# Patient Record
Sex: Male | Born: 1955 | Race: White | Hispanic: No | Marital: Single
Health system: Northeastern US, Academic
[De-identification: ages and names within clinical notes are randomized; demographics above are authoritative.]

---

## 2007-04-12 ENCOUNTER — Emergency Department: Payer: Self-pay | Admitting: Emergency Medicine

## 2007-05-19 ENCOUNTER — Other Ambulatory Visit: Payer: Self-pay

## 2007-05-20 ENCOUNTER — Observation Stay: Payer: Self-pay | Admitting: Internal Medicine

## 2007-05-20 ENCOUNTER — Other Ambulatory Visit: Payer: Self-pay

## 2009-07-27 IMAGING — US US PELVIS LIMITED
1 series · 17 of 25 positions shown · non-contrast
Comparison: none

REASON FOR EXAM: Testicular pain
COMMENTS:

[Series 1: us pelvis limited · 17 of 37 slices shown]
[im 1/37]
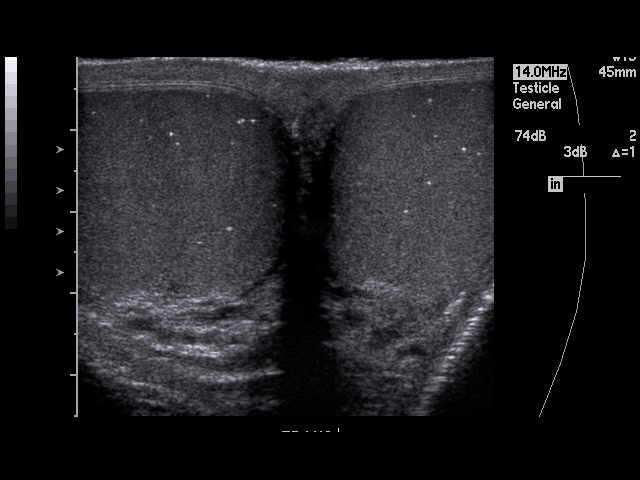
[im 4/37]
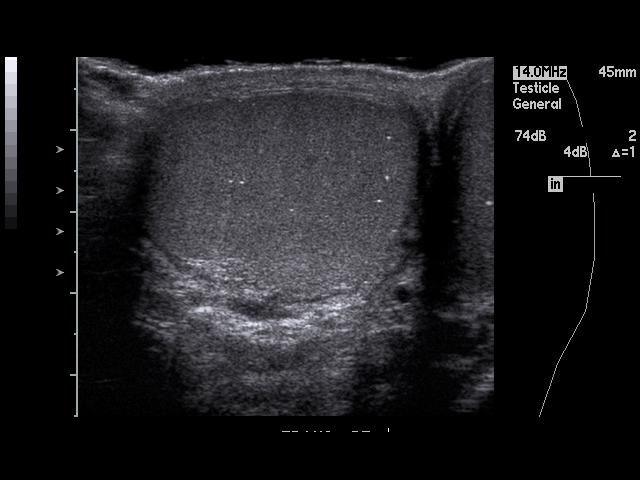
[im 5/37]
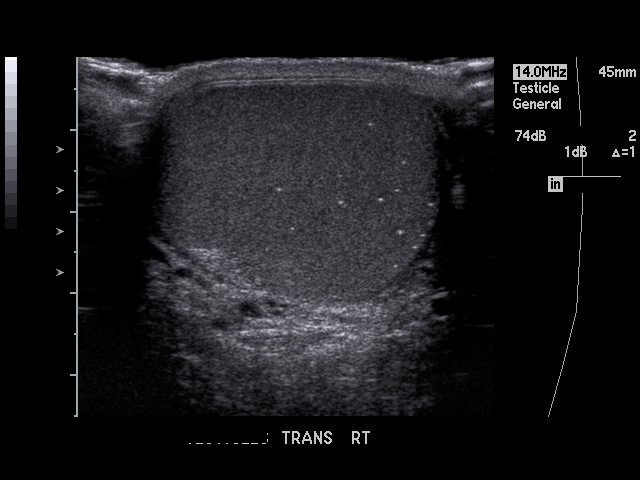
[im 8/37]
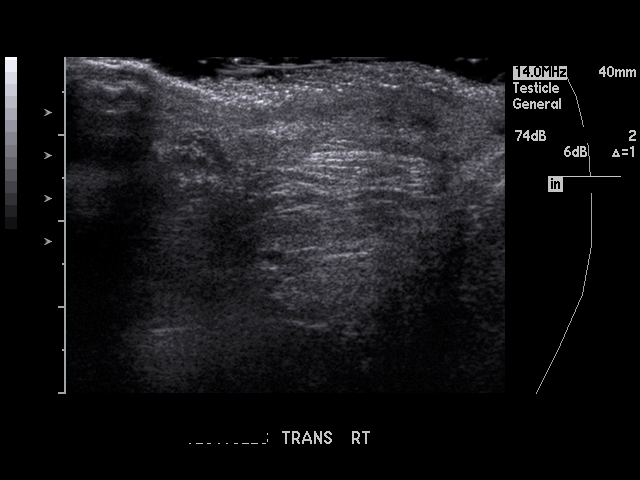
[im 10/37]
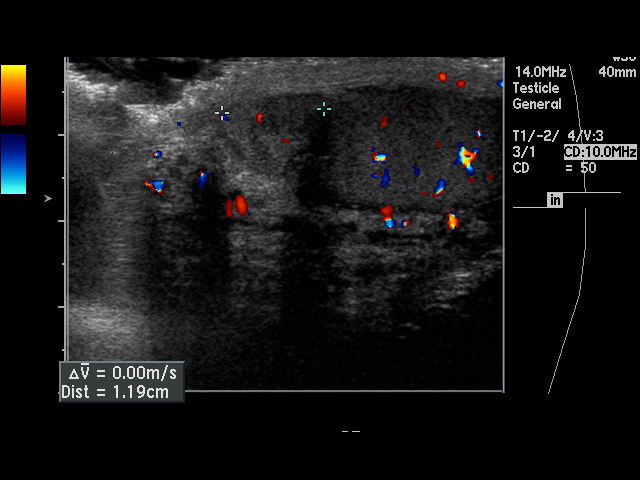
[im 13/37]
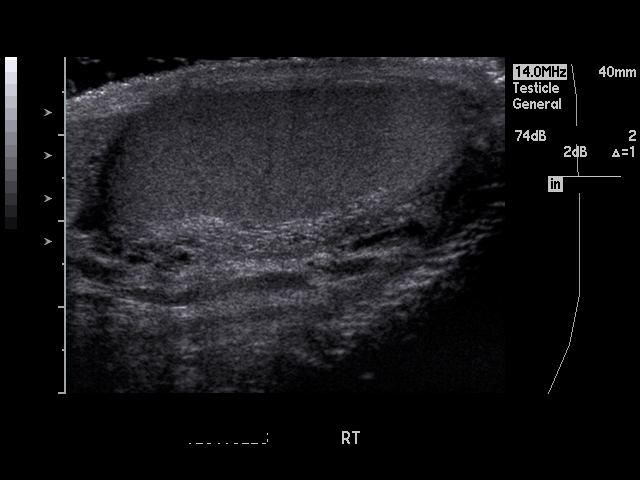
[im 14/37]
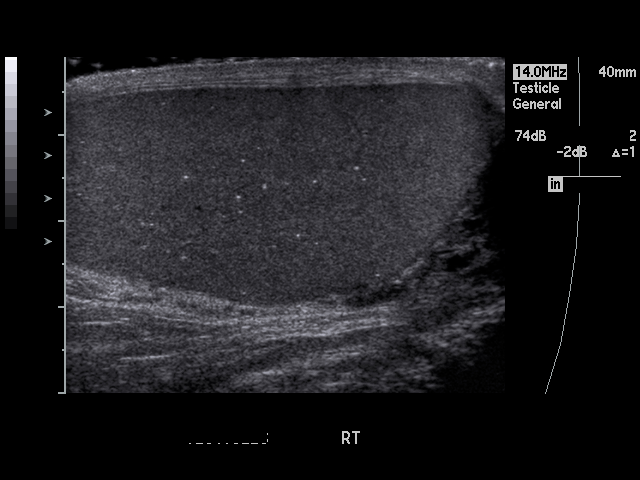
[im 17/37]
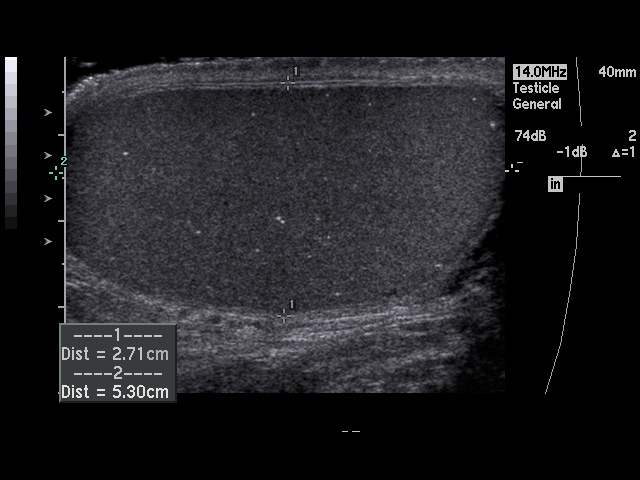
[im 19/37]
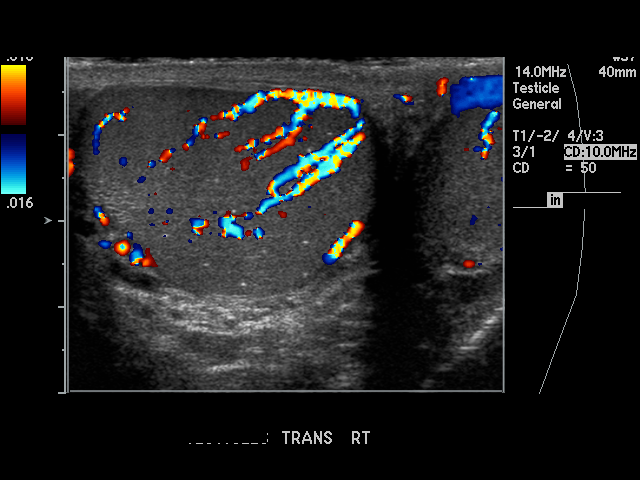
[im 20/37]
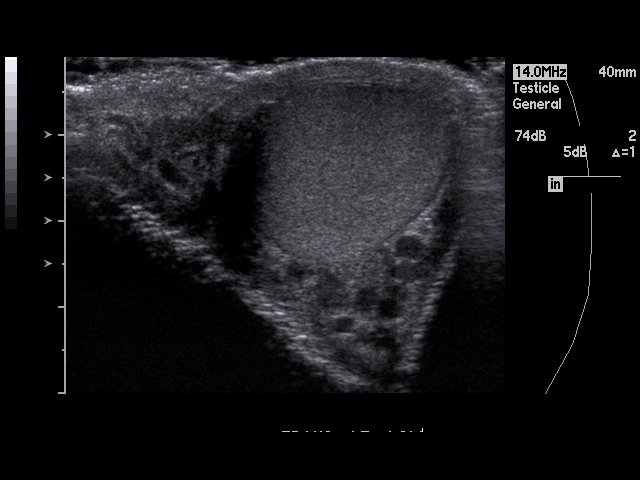
[im 23/37]
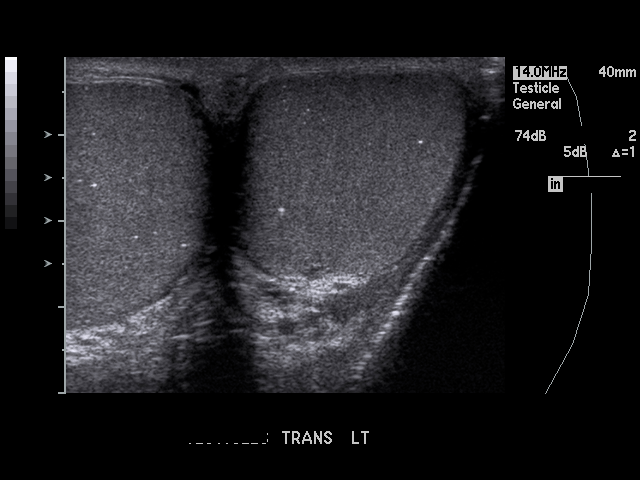
[im 25/37]
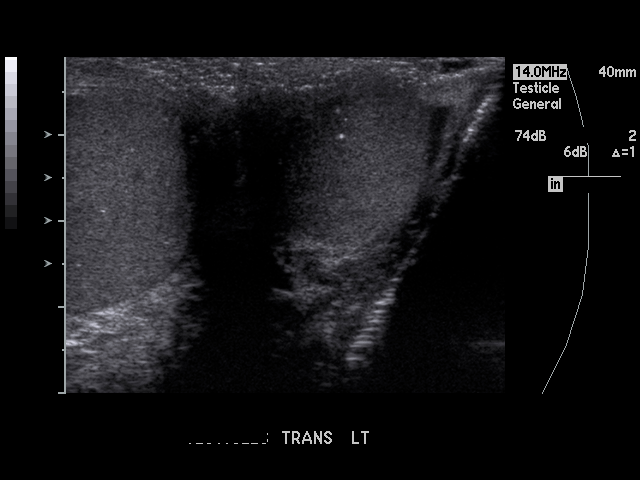
[im 28/37]
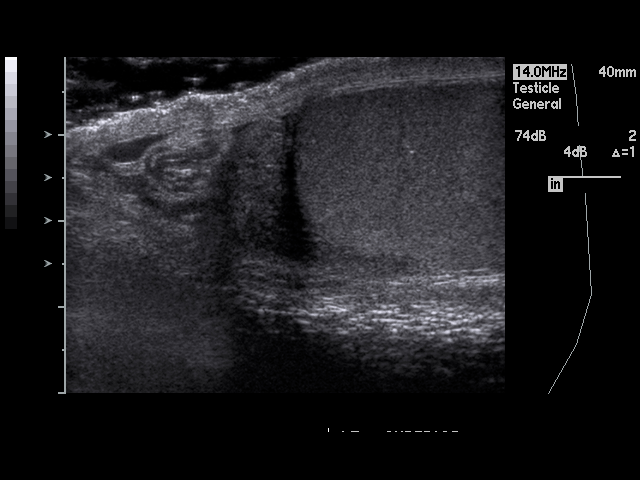
[im 29/37]
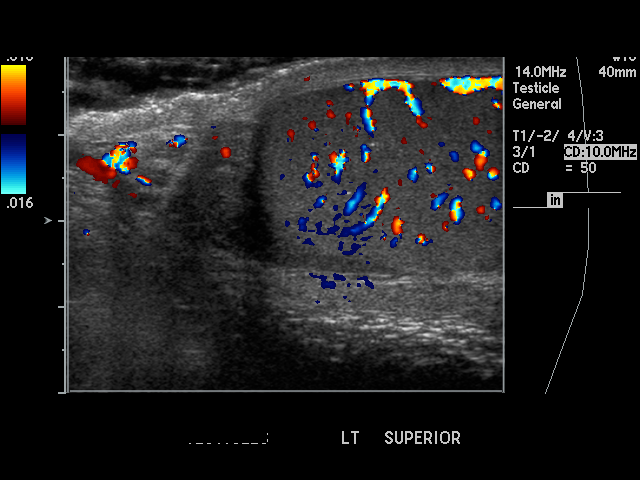
[im 32/37]
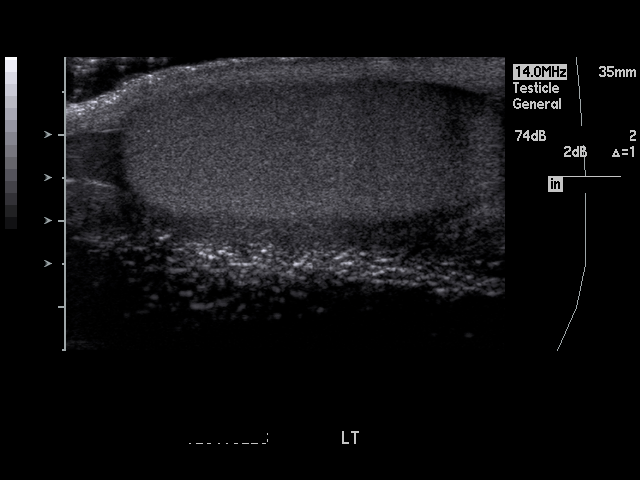
[im 34/37]
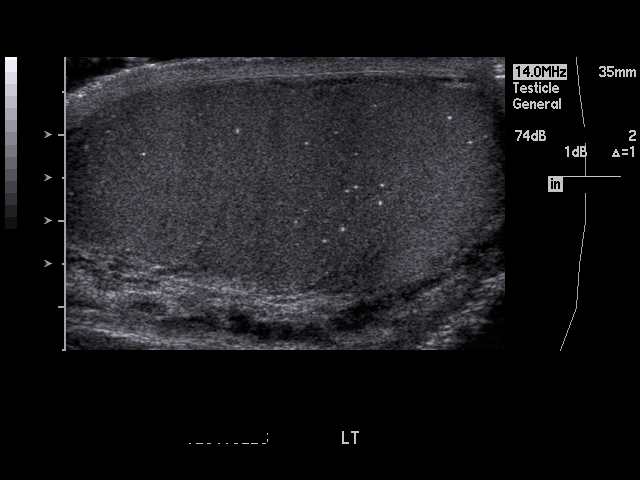
[im 37/37]
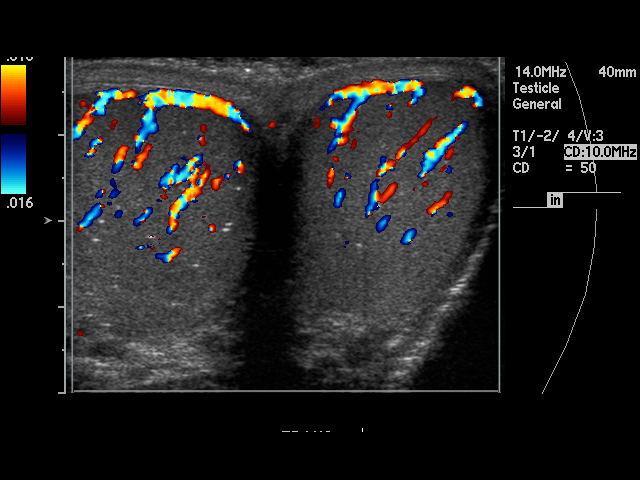

[17 of 25 positions shown; findings below may reference images not displayed]

PROCEDURE:     US  - US TESTICULAR  - April 12, 2007  [DATE]

RESULT:     The RIGHT testicle measures 5.3 x 2.7 x 3.4 cm. The LEFT
testicle measures 5.5 x 2.9 x 2.4 cm. The epididymal structures are normal
in appearance. No intratesticular masses are seen and the vascularity of the
testes is normal.
IMPRESSION: 1.  There are no findings to suggest testicular torsion or testicular
masses. There are very tiny echogenic foci within the testes compatible with
microliths which is a nonspecific finding. Follow-up clinically is
recommended.
2.  There are tiny, bilateral hydroceles.

A preliminary report was sent to the [HOSPITAL] the conclusion
of the study.

## 2011-10-03 LAB — URINALYSIS, COMPLETE
Bacteria: NONE SEEN
Bilirubin,UR: NEGATIVE
Glucose,UR: NEGATIVE mg/dL (ref 0–75)
Ketone: NEGATIVE
Leukocyte Esterase: NEGATIVE
Nitrite: NEGATIVE
Ph: 7 (ref 4.5–8.0)
RBC,UR: 1 /HPF (ref 0–5)
Specific Gravity: 1.005 (ref 1.003–1.030)
Squamous Epithelial: NONE SEEN

## 2011-10-03 LAB — CK TOTAL AND CKMB (NOT AT ARMC): CK, Total: 812 U/L — ABNORMAL HIGH (ref 35–232)

## 2011-10-03 LAB — COMPREHENSIVE METABOLIC PANEL
Alkaline Phosphatase: 128 U/L (ref 50–136)
Anion Gap: 13 (ref 7–16)
Calcium, Total: 8.2 mg/dL — ABNORMAL LOW (ref 8.5–10.1)
Chloride: 96 mmol/L — ABNORMAL LOW (ref 98–107)
EGFR (African American): >60
EGFR (Non-African Amer.): >60
Glucose: 104 mg/dL — ABNORMAL HIGH (ref 65–99)
Osmolality: 269 (ref 275–301)
Potassium: 3.4 mmol/L — ABNORMAL LOW (ref 3.5–5.1)
SGOT(AST): 89 U/L — ABNORMAL HIGH (ref 15–37)
SGPT (ALT): 69 U/L
Sodium: 134 mmol/L — ABNORMAL LOW (ref 136–145)
Total Protein: 8.5 g/dL — ABNORMAL HIGH (ref 6.4–8.2)

## 2011-10-03 LAB — CBC
HCT: 41.9 % (ref 40.0–52.0)
HGB: 14 g/dL (ref 13.0–18.0)
MCH: 32.1 pg (ref 26.0–34.0)
Platelet: 155 10*3/uL (ref 150–440)

## 2011-10-03 LAB — DRUG SCREEN, URINE
Amphetamines, Ur Screen: NEGATIVE (ref ?–1000)
Barbiturates, Ur Screen: NEGATIVE (ref ?–200)
Benzodiazepine, Ur Scrn: NEGATIVE (ref ?–200)
Cannabinoid 50 Ng, Ur ~~LOC~~: NEGATIVE (ref ?–50)
Cocaine Metabolite,Ur ~~LOC~~: NEGATIVE (ref ?–300)
Methadone, Ur Screen: NEGATIVE (ref ?–300)
Opiate, Ur Screen: NEGATIVE (ref ?–300)
Tricyclic, Ur Screen: NEGATIVE (ref ?–1000)

## 2011-10-03 LAB — ETHANOL
Ethanol %: 0.297 % — ABNORMAL HIGH (ref 0.000–0.080)
Ethanol: 297 mg/dL

## 2011-10-03 LAB — MAGNESIUM: Magnesium: 2 mg/dL

## 2011-10-04 ENCOUNTER — Inpatient Hospital Stay: Payer: Self-pay | Admitting: Internal Medicine

## 2011-10-04 LAB — COMPREHENSIVE METABOLIC PANEL
Albumin: 3.3 g/dL — ABNORMAL LOW (ref 3.4–5.0)
BUN: 13 mg/dL (ref 7–18)
Bilirubin,Total: 0.7 mg/dL (ref 0.2–1.0)
Chloride: 106 mmol/L (ref 98–107)
EGFR (African American): >60
SGOT(AST): 91 U/L — ABNORMAL HIGH (ref 15–37)
SGPT (ALT): 55 U/L

## 2011-10-04 LAB — CBC WITH DIFFERENTIAL/PLATELET
Basophil %: 0.5 %
Eosinophil %: 1 %
HCT: 36 % — ABNORMAL LOW (ref 40.0–52.0)
HGB: 12.1 g/dL — ABNORMAL LOW (ref 13.0–18.0)
Lymphocyte %: 22.8 %
MCH: 32.1 pg (ref 26.0–34.0)
MCV: 95 fL (ref 80–100)
Monocyte #: 0.6 x10 3/mm (ref 0.2–1.0)
Neutrophil #: 4.6 10*3/uL (ref 1.4–6.5)
Platelet: 132 10*3/uL — ABNORMAL LOW (ref 150–440)
RDW: 13 % (ref 11.5–14.5)
WBC: 6.9 10*3/uL (ref 3.8–10.6)

## 2011-10-04 LAB — POTASSIUM: Potassium: 3.4 mmol/L — ABNORMAL LOW (ref 3.5–5.1)

## 2011-10-05 LAB — URINALYSIS, COMPLETE
Bacteria: NONE SEEN
Bilirubin,UR: NEGATIVE
Leukocyte Esterase: NEGATIVE
Ph: 5 (ref 4.5–8.0)

## 2011-10-06 LAB — MAGNESIUM: Magnesium: 1.3 mg/dL — ABNORMAL LOW

## 2011-10-06 LAB — PHOSPHORUS: Phosphorus: 3.3 mg/dL (ref 2.5–4.9)

## 2011-10-07 LAB — CBC WITH DIFFERENTIAL/PLATELET
Basophil #: 0.6 10*3/uL — ABNORMAL HIGH (ref 0.0–0.1)
Basophil %: 4.3 %
Eosinophil #: 0.4 10*3/uL (ref 0.0–0.7)
HGB: 12.5 g/dL — ABNORMAL LOW (ref 13.0–18.0)
Lymphocyte #: 0.8 10*3/uL — ABNORMAL LOW (ref 1.0–3.6)
Lymphocyte %: 6.5 %
MCHC: 33.1 g/dL (ref 32.0–36.0)
MCV: 97 fL (ref 80–100)
Monocyte #: 2 x10 3/mm — ABNORMAL HIGH (ref 0.2–1.0)
Monocyte %: 15.5 %
Neutrophil %: 70.9 %
Platelet: 177 10*3/uL (ref 150–440)
RBC: 3.92 10*6/uL — ABNORMAL LOW (ref 4.40–5.90)
RDW: 13 % (ref 11.5–14.5)

## 2011-10-07 LAB — POTASSIUM: Potassium: 3 mmol/L — ABNORMAL LOW (ref 3.5–5.1)

## 2011-10-07 LAB — BASIC METABOLIC PANEL
Anion Gap: 10 (ref 7–16)
Calcium, Total: 8.4 mg/dL — ABNORMAL LOW (ref 8.5–10.1)
Creatinine: 0.91 mg/dL (ref 0.60–1.30)
EGFR (Non-African Amer.): >60
Glucose: 100 mg/dL — ABNORMAL HIGH (ref 65–99)
Potassium: 3.4 mmol/L — ABNORMAL LOW (ref 3.5–5.1)

## 2011-10-08 LAB — COMPREHENSIVE METABOLIC PANEL
Albumin: 2.1 g/dL — ABNORMAL LOW (ref 3.4–5.0)
Alkaline Phosphatase: 91 U/L (ref 50–136)
Bilirubin,Total: 0.8 mg/dL (ref 0.2–1.0)
Calcium, Total: 8.7 mg/dL (ref 8.5–10.1)
Chloride: 107 mmol/L (ref 98–107)
Co2: 21 mmol/L (ref 21–32)
EGFR (African American): >60
EGFR (Non-African Amer.): >60
Potassium: 5.8 mmol/L — ABNORMAL HIGH (ref 3.5–5.1)
SGOT(AST): 81 U/L — ABNORMAL HIGH (ref 15–37)
SGPT (ALT): 32 U/L
Sodium: 136 mmol/L (ref 136–145)
Total Protein: 7 g/dL (ref 6.4–8.2)

## 2011-10-08 LAB — LIPASE, BLOOD: Lipase: 479 U/L — ABNORMAL HIGH (ref 73–393)

## 2011-10-08 LAB — AMYLASE: Amylase: 92 U/L (ref 25–115)

## 2011-10-08 LAB — MAGNESIUM: Magnesium: 1.9 mg/dL

## 2011-10-09 LAB — BASIC METABOLIC PANEL
Anion Gap: 8 (ref 7–16)
BUN: 7 mg/dL (ref 7–18)
Calcium, Total: 8.5 mg/dL (ref 8.5–10.1)
Co2: 28 mmol/L (ref 21–32)
Creatinine: 0.67 mg/dL (ref 0.60–1.30)
EGFR (African American): >60
EGFR (Non-African Amer.): >60
Glucose: 115 mg/dL — ABNORMAL HIGH (ref 65–99)
Potassium: 3.7 mmol/L (ref 3.5–5.1)

## 2011-10-09 LAB — PHENYTOIN LEVEL, TOTAL: Dilantin: 3.9 ug/mL — ABNORMAL LOW (ref 10.0–20.0)

## 2011-10-09 LAB — MAGNESIUM: Magnesium: 1.3 mg/dL — ABNORMAL LOW

## 2011-10-09 LAB — VANCOMYCIN, TROUGH: Vancomycin, Trough: 11 ug/mL (ref 10–20)

## 2011-10-10 LAB — POTASSIUM
Potassium: 3.4 mmol/L — ABNORMAL LOW (ref 3.5–5.1)
Potassium: 3.9 mmol/L (ref 3.5–5.1)

## 2011-10-10 LAB — VANCOMYCIN, TROUGH: Vancomycin, Trough: 16 ug/mL (ref 10–20)

## 2011-10-11 LAB — BASIC METABOLIC PANEL
Anion Gap: 8 (ref 7–16)
Calcium, Total: 8.6 mg/dL (ref 8.5–10.1)
Chloride: 105 mmol/L (ref 98–107)
Co2: 29 mmol/L (ref 21–32)
Creatinine: 0.6 mg/dL (ref 0.60–1.30)
EGFR (African American): >60
EGFR (Non-African Amer.): >60
Glucose: 153 mg/dL — ABNORMAL HIGH (ref 65–99)
Osmolality: 285 (ref 275–301)
Sodium: 142 mmol/L (ref 136–145)

## 2011-10-11 LAB — CBC WITH DIFFERENTIAL/PLATELET
Basophil #: 0 10*3/uL (ref 0.0–0.1)
Basophil %: 0.8 %
HCT: 34.5 % — ABNORMAL LOW (ref 40.0–52.0)
HGB: 11.4 g/dL — ABNORMAL LOW (ref 13.0–18.0)
MCH: 31.8 pg (ref 26.0–34.0)
Monocyte %: 21.3 %
Neutrophil %: 50.2 %
Platelet: 293 10*3/uL (ref 150–440)
RBC: 3.58 10*6/uL — ABNORMAL LOW (ref 4.40–5.90)
RDW: 12.7 % (ref 11.5–14.5)

## 2011-10-11 LAB — MAGNESIUM: Magnesium: 1.5 mg/dL — ABNORMAL LOW

## 2011-10-12 LAB — BASIC METABOLIC PANEL
Anion Gap: 7 (ref 7–16)
BUN: 8 mg/dL (ref 7–18)
Calcium, Total: 8.5 mg/dL (ref 8.5–10.1)
Chloride: 103 mmol/L (ref 98–107)
Co2: 29 mmol/L (ref 21–32)
Creatinine: 0.69 mg/dL (ref 0.60–1.30)
EGFR (Non-African Amer.): >60
Glucose: 250 mg/dL — ABNORMAL HIGH (ref 65–99)
Osmolality: 284 (ref 275–301)
Sodium: 139 mmol/L (ref 136–145)

## 2011-10-12 LAB — PHOSPHORUS: Phosphorus: 3.9 mg/dL (ref 2.5–4.9)

## 2011-10-13 LAB — PHOSPHORUS: Phosphorus: 3.7 mg/dL (ref 2.5–4.9)

## 2011-10-13 LAB — PHENYTOIN LEVEL, TOTAL: Dilantin: 2.2 ug/mL — ABNORMAL LOW (ref 10.0–20.0)

## 2011-10-13 LAB — CALCIUM: Calcium, Total: 8.7 mg/dL (ref 8.5–10.1)

## 2011-10-13 LAB — CULTURE, BLOOD (SINGLE)

## 2011-10-14 LAB — CBC WITH DIFFERENTIAL/PLATELET
Basophil #: 0.1 10*3/uL (ref 0.0–0.1)
Basophil %: 0.5 %
Eosinophil #: 0.4 10*3/uL (ref 0.0–0.7)
Eosinophil %: 4.4 %
HGB: 11 g/dL — ABNORMAL LOW (ref 13.0–18.0)
Lymphocyte %: 15.3 %
MCHC: 33.2 g/dL (ref 32.0–36.0)
MCV: 96 fL (ref 80–100)
Monocyte #: 1.6 x10 3/mm — ABNORMAL HIGH (ref 0.2–1.0)
Monocyte %: 17.1 %
Neutrophil #: 5.9 10*3/uL (ref 1.4–6.5)
Neutrophil %: 62.7 %
Platelet: 349 10*3/uL (ref 150–440)
RDW: 12.5 % (ref 11.5–14.5)
WBC: 9.5 10*3/uL (ref 3.8–10.6)

## 2011-10-14 LAB — BASIC METABOLIC PANEL
Anion Gap: 6 — ABNORMAL LOW (ref 7–16)
Chloride: 96 mmol/L — ABNORMAL LOW (ref 98–107)
EGFR (Non-African Amer.): >60
Glucose: 306 mg/dL — ABNORMAL HIGH (ref 65–99)
Osmolality: 283 (ref 275–301)
Potassium: 3.6 mmol/L (ref 3.5–5.1)
Sodium: 136 mmol/L (ref 136–145)

## 2011-10-14 LAB — MAGNESIUM: Magnesium: 1.8 mg/dL

## 2011-10-14 LAB — PHOSPHORUS: Phosphorus: 3 mg/dL (ref 2.5–4.9)

## 2011-10-15 LAB — SODIUM: Sodium: 139 mmol/L (ref 136–145)

## 2011-10-15 LAB — ALBUMIN: Albumin: 2 g/dL — ABNORMAL LOW (ref 3.4–5.0)

## 2011-10-15 LAB — MAGNESIUM: Magnesium: 1.7 mg/dL — ABNORMAL LOW

## 2011-10-15 LAB — PHOSPHORUS: Phosphorus: 3.4 mg/dL (ref 2.5–4.9)

## 2011-10-15 LAB — CALCIUM: Calcium, Total: 9.1 mg/dL (ref 8.5–10.1)

## 2011-10-15 LAB — POTASSIUM: Potassium: 3.5 mmol/L (ref 3.5–5.1)

## 2011-10-16 LAB — CBC WITH DIFFERENTIAL/PLATELET
Basophil #: 0.1 10*3/uL (ref 0.0–0.1)
Basophil %: 0.4 %
Eosinophil %: 4.1 %
HCT: 33.3 % — ABNORMAL LOW (ref 40.0–52.0)
HGB: 10.4 g/dL — ABNORMAL LOW (ref 13.0–18.0)
Lymphocyte %: 11.4 %
MCHC: 31.2 g/dL — ABNORMAL LOW (ref 32.0–36.0)
MCV: 97 fL (ref 80–100)
Neutrophil %: 70.4 %
RBC: 3.44 10*6/uL — ABNORMAL LOW (ref 4.40–5.90)
RDW: 13 % (ref 11.5–14.5)
WBC: 14.9 10*3/uL — ABNORMAL HIGH (ref 3.8–10.6)

## 2011-10-16 LAB — BASIC METABOLIC PANEL
Anion Gap: 6 — ABNORMAL LOW (ref 7–16)
BUN: 15 mg/dL (ref 7–18)
Calcium, Total: 9.2 mg/dL (ref 8.5–10.1)
Chloride: 102 mmol/L (ref 98–107)
Co2: 32 mmol/L (ref 21–32)
Creatinine: 0.73 mg/dL (ref 0.60–1.30)
EGFR (African American): >60
EGFR (Non-African Amer.): >60

## 2011-10-16 LAB — URINALYSIS, COMPLETE
Bacteria: NONE SEEN
Ketone: NEGATIVE
Leukocyte Esterase: NEGATIVE
Nitrite: NEGATIVE
Protein: NEGATIVE
Specific Gravity: 1.01 (ref 1.003–1.030)

## 2011-10-17 LAB — BASIC METABOLIC PANEL
Anion Gap: 8 (ref 7–16)
BUN: 11 mg/dL (ref 7–18)
Chloride: 98 mmol/L (ref 98–107)
Co2: 34 mmol/L — ABNORMAL HIGH (ref 21–32)
Creatinine: 0.68 mg/dL (ref 0.60–1.30)
EGFR (Non-African Amer.): >60
Glucose: 161 mg/dL — ABNORMAL HIGH (ref 65–99)
Potassium: 3.6 mmol/L (ref 3.5–5.1)

## 2011-10-17 LAB — CBC WITH DIFFERENTIAL/PLATELET
Basophil #: 0.3 10*3/uL — ABNORMAL HIGH (ref 0.0–0.1)
Basophil %: 2.4 %
Eosinophil #: 0.9 10*3/uL — ABNORMAL HIGH (ref 0.0–0.7)
Eosinophil %: 7.7 %
HCT: 33.4 % — ABNORMAL LOW (ref 40.0–52.0)
HGB: 10.9 g/dL — ABNORMAL LOW (ref 13.0–18.0)
MCH: 31.2 pg (ref 26.0–34.0)
MCHC: 32.7 g/dL (ref 32.0–36.0)
Monocyte %: 14.4 %
Neutrophil #: 8 10*3/uL — ABNORMAL HIGH (ref 1.4–6.5)
WBC: 12.1 10*3/uL — ABNORMAL HIGH (ref 3.8–10.6)

## 2011-10-17 LAB — PHOSPHORUS: Phosphorus: 3.3 mg/dL (ref 2.5–4.9)

## 2011-10-18 LAB — CBC WITH DIFFERENTIAL/PLATELET
Bands: 7 %
Basophil: 1 %
Comment - H1-Com1: NORMAL
MCHC: 32.5 g/dL (ref 32.0–36.0)
MCV: 96 fL (ref 80–100)
Metamyelocyte: 1 %
Myelocyte: 4 %
RBC: 3.28 10*6/uL — ABNORMAL LOW (ref 4.40–5.90)
RDW: 13 % (ref 11.5–14.5)
Segmented Neutrophils: 54 %
Variant Lymphocyte - H1-Rlymph: 9 %
WBC: 12.1 10*3/uL — ABNORMAL HIGH (ref 3.8–10.6)

## 2011-10-18 LAB — BASIC METABOLIC PANEL
BUN: 15 mg/dL (ref 7–18)
Calcium, Total: 9.2 mg/dL (ref 8.5–10.1)
Co2: 35 mmol/L — ABNORMAL HIGH (ref 21–32)
EGFR (African American): >60
EGFR (Non-African Amer.): >60
Potassium: 4.1 mmol/L (ref 3.5–5.1)

## 2011-10-18 LAB — MAGNESIUM: Magnesium: 1.9 mg/dL

## 2011-10-19 LAB — CBC WITH DIFFERENTIAL/PLATELET
Bands: 3 %
Comment - H1-Com2: NORMAL
Eosinophil: 3 %
HGB: 10.7 g/dL — ABNORMAL LOW (ref 13.0–18.0)
Lymphocytes: 12 %
MCHC: 33 g/dL (ref 32.0–36.0)
Metamyelocyte: 1 %
Monocytes: 21 %
Platelet: 399 10*3/uL (ref 150–440)
RBC: 3.39 10*6/uL — ABNORMAL LOW (ref 4.40–5.90)
RDW: 13 % (ref 11.5–14.5)
Variant Lymphocyte - H1-Rlymph: 1 %

## 2011-10-19 LAB — BASIC METABOLIC PANEL
Anion Gap: 7 (ref 7–16)
Calcium, Total: 9.3 mg/dL (ref 8.5–10.1)
Chloride: 99 mmol/L (ref 98–107)
Creatinine: 0.78 mg/dL (ref 0.60–1.30)
EGFR (African American): >60
EGFR (Non-African Amer.): >60
Glucose: 153 mg/dL — ABNORMAL HIGH (ref 65–99)
Osmolality: 286 (ref 275–301)
Potassium: 4.1 mmol/L (ref 3.5–5.1)

## 2011-10-19 LAB — PHOSPHORUS: Phosphorus: 4.3 mg/dL (ref 2.5–4.9)

## 2011-10-19 LAB — MAGNESIUM: Magnesium: 1.8 mg/dL

## 2011-10-20 DIAGNOSIS — I509 Heart failure, unspecified: Secondary | ICD-10-CM

## 2011-10-20 LAB — CBC WITH DIFFERENTIAL/PLATELET
Basophil #: 0.1 10*3/uL (ref 0.0–0.1)
Eosinophil %: 4.1 %
HCT: 30.7 % — ABNORMAL LOW (ref 40.0–52.0)
HGB: 10.3 g/dL — ABNORMAL LOW (ref 13.0–18.0)
Lymphocyte #: 1.2 10*3/uL (ref 1.0–3.6)
MCH: 32.1 pg (ref 26.0–34.0)
MCV: 96 fL (ref 80–100)
Monocyte #: 2.3 x10 3/mm — ABNORMAL HIGH (ref 0.2–1.0)
Monocyte %: 17.5 %
Neutrophil #: 9 10*3/uL — ABNORMAL HIGH (ref 1.4–6.5)
Neutrophil %: 68.4 %
RDW: 12.7 % (ref 11.5–14.5)
WBC: 13.1 10*3/uL — ABNORMAL HIGH (ref 3.8–10.6)

## 2011-10-20 LAB — BASIC METABOLIC PANEL
Co2: 33 mmol/L — ABNORMAL HIGH (ref 21–32)
Creatinine: 0.61 mg/dL (ref 0.60–1.30)
EGFR (African American): >60
EGFR (Non-African Amer.): >60
Osmolality: 282 (ref 275–301)
Potassium: 3.5 mmol/L (ref 3.5–5.1)
Sodium: 138 mmol/L (ref 136–145)

## 2011-10-20 LAB — PROTIME-INR
INR: 1.1
Prothrombin Time: 14.8 secs — ABNORMAL HIGH (ref 11.5–14.7)

## 2011-10-21 LAB — BASIC METABOLIC PANEL
Anion Gap: 6 — ABNORMAL LOW (ref 7–16)
Calcium, Total: 8.7 mg/dL (ref 8.5–10.1)
Chloride: 101 mmol/L (ref 98–107)
Co2: 32 mmol/L (ref 21–32)
Creatinine: 0.56 mg/dL — ABNORMAL LOW (ref 0.60–1.30)
Glucose: 124 mg/dL — ABNORMAL HIGH (ref 65–99)

## 2011-10-21 LAB — URINALYSIS, COMPLETE
Glucose,UR: NEGATIVE mg/dL (ref 0–75)
Hyaline Cast: 1
Leukocyte Esterase: NEGATIVE
Nitrite: NEGATIVE
RBC,UR: <1 /HPF (ref 0–5)
Specific Gravity: 1.012 (ref 1.003–1.030)

## 2011-10-21 LAB — MAGNESIUM: Magnesium: 1.8 mg/dL

## 2011-10-21 LAB — CBC WITH DIFFERENTIAL/PLATELET
Basophil #: 0.1 10*3/uL (ref 0.0–0.1)
Basophil %: 1.1 %
Lymphocyte %: 5.4 %
MCH: 31 pg (ref 26.0–34.0)
MCHC: 32.5 g/dL (ref 32.0–36.0)
MCV: 96 fL (ref 80–100)
Neutrophil #: 7.9 10*3/uL — ABNORMAL HIGH (ref 1.4–6.5)
Neutrophil %: 69.8 %
RBC: 3.21 10*6/uL — ABNORMAL LOW (ref 4.40–5.90)
WBC: 11.3 10*3/uL — ABNORMAL HIGH (ref 3.8–10.6)

## 2011-10-22 LAB — CBC WITH DIFFERENTIAL/PLATELET
Basophil %: 0.5 %
Eosinophil #: 0 10*3/uL (ref 0.0–0.7)
HCT: 30.4 % — ABNORMAL LOW (ref 40.0–52.0)
Lymphocyte #: 0.5 10*3/uL — ABNORMAL LOW (ref 1.0–3.6)
Lymphocyte %: 2.8 %
MCH: 30.2 pg (ref 26.0–34.0)
MCHC: 31.7 g/dL — ABNORMAL LOW (ref 32.0–36.0)
MCV: 95 fL (ref 80–100)
Monocyte #: 1.6 x10 3/mm — ABNORMAL HIGH (ref 0.2–1.0)
Neutrophil %: 87.4 %
RBC: 3.2 10*6/uL — ABNORMAL LOW (ref 4.40–5.90)
RDW: 12.4 % (ref 11.5–14.5)

## 2011-10-22 LAB — BASIC METABOLIC PANEL
Anion Gap: 6 — ABNORMAL LOW (ref 7–16)
BUN: 15 mg/dL (ref 7–18)
Calcium, Total: 9 mg/dL (ref 8.5–10.1)
Chloride: 99 mmol/L (ref 98–107)
Co2: 33 mmol/L — ABNORMAL HIGH (ref 21–32)
Creatinine: 0.61 mg/dL (ref 0.60–1.30)
EGFR (African American): >60
EGFR (Non-African Amer.): >60
Glucose: 143 mg/dL — ABNORMAL HIGH (ref 65–99)
Osmolality: 279 (ref 275–301)
Potassium: 3.5 mmol/L (ref 3.5–5.1)
Sodium: 138 mmol/L (ref 136–145)

## 2011-10-22 LAB — PHOSPHORUS: Phosphorus: 2.9 mg/dL (ref 2.5–4.9)

## 2011-10-22 LAB — MAGNESIUM: Magnesium: 1.7 mg/dL — ABNORMAL LOW

## 2011-10-23 LAB — CBC WITH DIFFERENTIAL/PLATELET
Basophil #: 0 10*3/uL (ref 0.0–0.1)
Basophil %: 0.4 %
HCT: 28.9 % — ABNORMAL LOW (ref 40.0–52.0)
Lymphocyte #: 0.8 10*3/uL — ABNORMAL LOW (ref 1.0–3.6)
Lymphocyte %: 6.7 %
MCH: 30 pg (ref 26.0–34.0)
MCV: 96 fL (ref 80–100)
Monocyte #: 1.1 x10 3/mm — ABNORMAL HIGH (ref 0.2–1.0)
Monocyte %: 9.5 %
RBC: 3.02 10*6/uL — ABNORMAL LOW (ref 4.40–5.90)
RDW: 12.5 % (ref 11.5–14.5)
WBC: 11.9 10*3/uL — ABNORMAL HIGH (ref 3.8–10.6)

## 2011-10-23 LAB — LIPASE, BLOOD: Lipase: 313 U/L (ref 73–393)

## 2011-10-23 LAB — BASIC METABOLIC PANEL
BUN: 17 mg/dL (ref 7–18)
Calcium, Total: 8.9 mg/dL (ref 8.5–10.1)
Chloride: 100 mmol/L (ref 98–107)
Co2: 32 mmol/L (ref 21–32)
EGFR (African American): >60
EGFR (Non-African Amer.): >60
Glucose: 132 mg/dL — ABNORMAL HIGH (ref 65–99)
Osmolality: 281 (ref 275–301)

## 2011-10-23 LAB — MAGNESIUM: Magnesium: 1.7 mg/dL — ABNORMAL LOW

## 2011-10-24 LAB — CBC WITH DIFFERENTIAL/PLATELET
Basophil #: 0.1 10*3/uL (ref 0.0–0.1)
Eosinophil #: 0.4 10*3/uL (ref 0.0–0.7)
Eosinophil %: 3.2 %
Eosinophil: 4 %
HCT: 28.3 % — ABNORMAL LOW (ref 40.0–52.0)
Lymphocyte #: 1.2 10*3/uL (ref 1.0–3.6)
Lymphocytes: 6 %
MCH: 30.4 pg (ref 26.0–34.0)
MCHC: 31.7 g/dL — ABNORMAL LOW (ref 32.0–36.0)
MCV: 96 fL (ref 80–100)
Monocyte %: 21.9 %
Monocytes: 21 %
Platelet: 227 10*3/uL (ref 150–440)
RDW: 13 % (ref 11.5–14.5)
Segmented Neutrophils: 67 %
WBC: 12.1 10*3/uL — ABNORMAL HIGH (ref 3.8–10.6)

## 2011-10-24 LAB — MAGNESIUM: Magnesium: 1.9 mg/dL

## 2011-10-24 LAB — BASIC METABOLIC PANEL
Anion Gap: 7 (ref 7–16)
Chloride: 102 mmol/L (ref 98–107)
Co2: 32 mmol/L (ref 21–32)
Creatinine: 0.85 mg/dL (ref 0.60–1.30)
EGFR (African American): >60
EGFR (Non-African Amer.): >60
Glucose: 148 mg/dL — ABNORMAL HIGH (ref 65–99)
Osmolality: 286 (ref 275–301)
Potassium: 3.1 mmol/L — ABNORMAL LOW (ref 3.5–5.1)
Sodium: 141 mmol/L (ref 136–145)

## 2011-10-24 LAB — PHOSPHORUS: Phosphorus: 4.1 mg/dL (ref 2.5–4.9)

## 2011-10-25 LAB — CBC WITH DIFFERENTIAL/PLATELET
Basophil #: 0.1 10*3/uL (ref 0.0–0.1)
Basophil %: 1.2 %
Eosinophil #: 0.9 10*3/uL — ABNORMAL HIGH (ref 0.0–0.7)
Eosinophil %: 7.7 %
Eosinophil: 10 %
HCT: 29.7 % — ABNORMAL LOW (ref 40.0–52.0)
HGB: 9.6 g/dL — ABNORMAL LOW (ref 13.0–18.0)
Lymphocyte #: 1.6 10*3/uL (ref 1.0–3.6)
Lymphocyte %: 14.3 %
MCV: 94 fL (ref 80–100)
Monocyte %: 22.4 %
Monocytes: 17 %
Neutrophil %: 54.4 %
RBC: 3.16 10*6/uL — ABNORMAL LOW (ref 4.40–5.90)
WBC: 11.2 10*3/uL — ABNORMAL HIGH (ref 3.8–10.6)

## 2011-10-25 LAB — MAGNESIUM: Magnesium: 1.7 mg/dL — ABNORMAL LOW

## 2011-10-25 LAB — POTASSIUM: Potassium: 3.8 mmol/L (ref 3.5–5.1)

## 2011-10-25 LAB — BASIC METABOLIC PANEL
Chloride: 96 mmol/L — ABNORMAL LOW (ref 98–107)
Creatinine: 0.6 mg/dL (ref 0.60–1.30)
EGFR (Non-African Amer.): >60
Sodium: 138 mmol/L (ref 136–145)

## 2011-10-25 LAB — CALCIUM: Calcium, Total: 9.1 mg/dL (ref 8.5–10.1)

## 2011-10-26 LAB — VANCOMYCIN, TROUGH: Vancomycin, Trough: 14 ug/mL (ref 10–20)

## 2011-10-26 LAB — BASIC METABOLIC PANEL
Calcium, Total: 9 mg/dL (ref 8.5–10.1)
Chloride: 99 mmol/L (ref 98–107)
Co2: 32 mmol/L (ref 21–32)
Creatinine: 0.76 mg/dL (ref 0.60–1.30)
EGFR (African American): >60
Sodium: 139 mmol/L (ref 136–145)

## 2011-10-27 LAB — CREATININE, SERUM
Creatinine: 0.67 mg/dL (ref 0.60–1.30)
EGFR (African American): >60
EGFR (Non-African Amer.): >60

## 2011-10-27 LAB — CBC WITH DIFFERENTIAL/PLATELET
Eosinophil %: 3.8 %
Eosinophil: 1 %
Lymphocyte %: 9.3 %
MCH: 30.5 pg (ref 26.0–34.0)
MCHC: 32.6 g/dL (ref 32.0–36.0)
MCV: 94 fL (ref 80–100)
Monocyte #: 2.7 x10 3/mm — ABNORMAL HIGH (ref 0.2–1.0)
Monocyte %: 15.4 %
Platelet: 309 10*3/uL (ref 150–440)
RBC: 3 10*6/uL — ABNORMAL LOW (ref 4.40–5.90)
RDW: 13.5 % (ref 11.5–14.5)
Segmented Neutrophils: 77 %
WBC: 17.5 10*3/uL — ABNORMAL HIGH (ref 3.8–10.6)

## 2011-10-27 LAB — CALCIUM: Calcium, Total: 9 mg/dL (ref 8.5–10.1)

## 2011-10-27 LAB — POTASSIUM: Potassium: 3.3 mmol/L — ABNORMAL LOW (ref 3.5–5.1)

## 2011-10-27 LAB — PHOSPHORUS: Phosphorus: 3 mg/dL (ref 2.5–4.9)

## 2011-10-28 LAB — MAGNESIUM: Magnesium: 2 mg/dL

## 2011-10-28 LAB — POTASSIUM: Potassium: 3.9 mmol/L (ref 3.5–5.1)

## 2011-10-28 LAB — PHOSPHORUS: Phosphorus: 3.4 mg/dL (ref 2.5–4.9)

## 2011-10-29 LAB — CBC WITH DIFFERENTIAL/PLATELET
Bands: 5 %
Basophil: 1 %
Eosinophil: 13 %
HCT: 31.5 % — ABNORMAL LOW (ref 40.0–52.0)
HGB: 10.2 g/dL — ABNORMAL LOW (ref 13.0–18.0)
Lymphocytes: 14 %
MCHC: 32.3 g/dL (ref 32.0–36.0)
Metamyelocyte: 3 %
Myelocyte: 3 %
Other Cells Blood: 2
RDW: 13 % (ref 11.5–14.5)
Segmented Neutrophils: 48 %
WBC: 13.7 10*3/uL — ABNORMAL HIGH (ref 3.8–10.6)

## 2011-10-29 LAB — BASIC METABOLIC PANEL
BUN: 21 mg/dL — ABNORMAL HIGH (ref 7–18)
Calcium, Total: 9.4 mg/dL (ref 8.5–10.1)
Chloride: 99 mmol/L (ref 98–107)
Co2: 33 mmol/L — ABNORMAL HIGH (ref 21–32)
EGFR (African American): >60
EGFR (Non-African Amer.): >60
Glucose: 125 mg/dL — ABNORMAL HIGH (ref 65–99)
Potassium: 4 mmol/L (ref 3.5–5.1)
Sodium: 138 mmol/L (ref 136–145)

## 2011-10-29 LAB — CULTURE, BLOOD (SINGLE)

## 2011-10-30 LAB — POTASSIUM: Potassium: 3.7 mmol/L (ref 3.5–5.1)

## 2011-10-30 LAB — MAGNESIUM: Magnesium: 2 mg/dL

## 2011-10-30 LAB — PHOSPHORUS: Phosphorus: 3.3 mg/dL (ref 2.5–4.9)

## 2011-10-30 LAB — SODIUM: Sodium: 138 mmol/L (ref 136–145)

## 2011-10-30 LAB — CALCIUM: Calcium, Total: 9.8 mg/dL (ref 8.5–10.1)

## 2011-10-31 LAB — CBC WITH DIFFERENTIAL/PLATELET
Basophil #: 0.1 10*3/uL (ref 0.0–0.1)
Basophil %: 1.1 %
Eosinophil #: 0.8 10*3/uL — ABNORMAL HIGH (ref 0.0–0.7)
Eosinophil %: 7.5 %
HCT: 29.6 % — ABNORMAL LOW (ref 40.0–52.0)
Lymphocyte %: 12 %
MCH: 30.5 pg (ref 26.0–34.0)
MCHC: 32.5 g/dL (ref 32.0–36.0)
MCV: 94 fL (ref 80–100)
Neutrophil #: 7 10*3/uL — ABNORMAL HIGH (ref 1.4–6.5)
RBC: 3.16 10*6/uL — ABNORMAL LOW (ref 4.40–5.90)
RDW: 13.2 % (ref 11.5–14.5)

## 2011-10-31 LAB — PHOSPHORUS: Phosphorus: 3.7 mg/dL (ref 2.5–4.9)

## 2011-11-01 LAB — PHOSPHORUS: Phosphorus: 3.4 mg/dL (ref 2.5–4.9)

## 2011-11-01 LAB — SODIUM: Sodium: 143 mmol/L (ref 136–145)

## 2011-11-01 LAB — POTASSIUM: Potassium: 3.7 mmol/L (ref 3.5–5.1)

## 2011-11-05 LAB — CBC WITH DIFFERENTIAL/PLATELET
Basophil %: 1.5 %
Eosinophil %: 9.9 %
HCT: 34.8 % — ABNORMAL LOW (ref 40.0–52.0)
MCHC: 31.8 g/dL — ABNORMAL LOW (ref 32.0–36.0)
MCV: 93 fL (ref 80–100)
Monocyte %: 10.4 %
Neutrophil #: 6.2 10*3/uL (ref 1.4–6.5)
RBC: 3.74 10*6/uL — ABNORMAL LOW (ref 4.40–5.90)
RDW: 13.4 % (ref 11.5–14.5)
WBC: 10 10*3/uL (ref 3.8–10.6)

## 2011-11-05 LAB — BASIC METABOLIC PANEL
Anion Gap: 7 (ref 7–16)
BUN: 27 mg/dL — ABNORMAL HIGH (ref 7–18)
Calcium, Total: 9.8 mg/dL (ref 8.5–10.1)
Co2: 32 mmol/L (ref 21–32)
EGFR (African American): >60
Glucose: 112 mg/dL — ABNORMAL HIGH (ref 65–99)
Potassium: 3 mmol/L — ABNORMAL LOW (ref 3.5–5.1)
Sodium: 150 mmol/L — ABNORMAL HIGH (ref 136–145)

## 2011-11-05 LAB — MAGNESIUM: Magnesium: 1.9 mg/dL

## 2011-11-06 LAB — BASIC METABOLIC PANEL
Anion Gap: 8 (ref 7–16)
BUN: 24 mg/dL — ABNORMAL HIGH (ref 7–18)
Calcium, Total: 9.3 mg/dL (ref 8.5–10.1)
Chloride: 107 mmol/L (ref 98–107)
EGFR (African American): >60
EGFR (Non-African Amer.): >60
Glucose: 112 mg/dL — ABNORMAL HIGH (ref 65–99)
Osmolality: 292 (ref 275–301)
Potassium: 3.4 mmol/L — ABNORMAL LOW (ref 3.5–5.1)

## 2011-11-07 LAB — BASIC METABOLIC PANEL
Calcium, Total: 9.5 mg/dL (ref 8.5–10.1)
Co2: 27 mmol/L (ref 21–32)
EGFR (African American): >60
EGFR (Non-African Amer.): >60
Glucose: 95 mg/dL (ref 65–99)
Osmolality: 278 (ref 275–301)

## 2011-11-08 LAB — CBC WITH DIFFERENTIAL/PLATELET
Basophil #: 0.1 10*3/uL (ref 0.0–0.1)
Basophil %: 0.7 %
Eosinophil #: 1 10*3/uL — ABNORMAL HIGH (ref 0.0–0.7)
HCT: 34.8 % — ABNORMAL LOW (ref 40.0–52.0)
HGB: 11.3 g/dL — ABNORMAL LOW (ref 13.0–18.0)
Lymphocyte #: 1.7 10*3/uL (ref 1.0–3.6)
MCH: 29.9 pg (ref 26.0–34.0)
MCHC: 32.4 g/dL (ref 32.0–36.0)
MCV: 92 fL (ref 80–100)
Neutrophil #: 7.6 10*3/uL — ABNORMAL HIGH (ref 1.4–6.5)
Neutrophil %: 67.1 %
RBC: 3.78 10*6/uL — ABNORMAL LOW (ref 4.40–5.90)
RDW: 13.5 % (ref 11.5–14.5)
WBC: 11.3 10*3/uL — ABNORMAL HIGH (ref 3.8–10.6)

## 2011-11-08 LAB — BASIC METABOLIC PANEL
Calcium, Total: 9.3 mg/dL (ref 8.5–10.1)
Chloride: 101 mmol/L (ref 98–107)
Co2: 28 mmol/L (ref 21–32)
Creatinine: 0.54 mg/dL — ABNORMAL LOW (ref 0.60–1.30)
EGFR (African American): >60
EGFR (Non-African Amer.): >60
Glucose: 101 mg/dL — ABNORMAL HIGH (ref 65–99)
Sodium: 138 mmol/L (ref 136–145)

## 2011-11-09 LAB — BASIC METABOLIC PANEL
Anion Gap: 9 (ref 7–16)
Chloride: 98 mmol/L (ref 98–107)
Co2: 29 mmol/L (ref 21–32)
Creatinine: 0.69 mg/dL (ref 0.60–1.30)
EGFR (Non-African Amer.): >60
Glucose: 80 mg/dL (ref 65–99)
Osmolality: 270 (ref 275–301)
Potassium: 3.9 mmol/L (ref 3.5–5.1)
Sodium: 136 mmol/L (ref 136–145)

## 2011-11-09 LAB — CBC WITH DIFFERENTIAL/PLATELET
Basophil #: 0.1 10*3/uL (ref 0.0–0.1)
Eosinophil %: 8.8 %
HCT: 33.8 % — ABNORMAL LOW (ref 40.0–52.0)
Lymphocyte %: 15.2 %
MCHC: 32.2 g/dL (ref 32.0–36.0)
MCV: 92 fL (ref 80–100)
Monocyte #: 1.2 x10 3/mm — ABNORMAL HIGH (ref 0.2–1.0)
Monocyte %: 12.2 %
Neutrophil #: 6.1 10*3/uL (ref 1.4–6.5)
Platelet: 319 10*3/uL (ref 150–440)
RDW: 13.3 % (ref 11.5–14.5)
WBC: 9.8 10*3/uL (ref 3.8–10.6)

## 2011-11-10 LAB — BASIC METABOLIC PANEL
BUN: 8 mg/dL (ref 7–18)
Chloride: 100 mmol/L (ref 98–107)
Co2: 28 mmol/L (ref 21–32)
EGFR (African American): >60
Glucose: 109 mg/dL — ABNORMAL HIGH (ref 65–99)
Sodium: 137 mmol/L (ref 136–145)

## 2011-11-10 LAB — MAGNESIUM: Magnesium: 1.2 mg/dL — ABNORMAL LOW

## 2011-11-11 LAB — EXPECTORATED SPUTUM ASSESSMENT W GRAM STAIN, RFLX TO RESP C

## 2011-12-21 ENCOUNTER — Ambulatory Visit: Payer: Self-pay | Admitting: Physician Assistant

## 2012-01-30 ENCOUNTER — Encounter: Payer: Self-pay | Admitting: Family Medicine

## 2012-03-10 ENCOUNTER — Emergency Department: Payer: Self-pay | Admitting: Emergency Medicine

## 2012-03-10 LAB — URINALYSIS, COMPLETE
Bacteria: NONE SEEN
Glucose,UR: NEGATIVE mg/dL (ref 0–75)
Ketone: NEGATIVE
Leukocyte Esterase: NEGATIVE
Ph: 7 (ref 4.5–8.0)
Protein: NEGATIVE
RBC,UR: 1 /HPF (ref 0–5)
Squamous Epithelial: NONE SEEN
WBC UR: NONE SEEN /HPF (ref 0–5)

## 2012-03-10 LAB — DRUG SCREEN, URINE
Barbiturates, Ur Screen: NEGATIVE (ref ?–200)
Cocaine Metabolite,Ur ~~LOC~~: NEGATIVE (ref ?–300)
Methadone, Ur Screen: NEGATIVE (ref ?–300)
Opiate, Ur Screen: NEGATIVE (ref ?–300)
Phencyclidine (PCP) Ur S: NEGATIVE (ref ?–25)
Tricyclic, Ur Screen: NEGATIVE (ref ?–1000)

## 2012-05-30 ENCOUNTER — Inpatient Hospital Stay: Payer: Self-pay | Admitting: Specialist

## 2012-05-30 LAB — SALICYLATE LEVEL: Salicylates, Serum: 4.8 mg/dL — ABNORMAL HIGH

## 2012-05-30 LAB — CBC
HCT: 34.9 % — ABNORMAL LOW (ref 40.0–52.0)
HGB: 11.7 g/dL — ABNORMAL LOW (ref 13.0–18.0)
MCHC: 33.4 g/dL (ref 32.0–36.0)
Platelet: 200 10*3/uL (ref 150–440)
RDW: 13.6 % (ref 11.5–14.5)
WBC: 11.8 10*3/uL — ABNORMAL HIGH (ref 3.8–10.6)

## 2012-05-30 LAB — COMPREHENSIVE METABOLIC PANEL
Albumin: 3.4 g/dL (ref 3.4–5.0)
Alkaline Phosphatase: 153 U/L — ABNORMAL HIGH (ref 50–136)
BUN: 18 mg/dL (ref 7–18)
Bilirubin,Total: 3.7 mg/dL — ABNORMAL HIGH (ref 0.2–1.0)
Creatinine: 1.34 mg/dL — ABNORMAL HIGH (ref 0.60–1.30)
Glucose: 132 mg/dL — ABNORMAL HIGH (ref 65–99)
Osmolality: 261 (ref 275–301)
SGOT(AST): 147 U/L — ABNORMAL HIGH (ref 15–37)
SGPT (ALT): 74 U/L (ref 12–78)
Sodium: 128 mmol/L — ABNORMAL LOW (ref 136–145)
Total Protein: 8 g/dL (ref 6.4–8.2)

## 2012-05-30 LAB — TSH: Thyroid Stimulating Horm: 0.91 u[IU]/mL

## 2012-05-30 LAB — CK TOTAL AND CKMB (NOT AT ARMC)
CK, Total: 141 U/L (ref 35–232)
CK-MB: 0.7 ng/mL (ref 0.5–3.6)

## 2012-05-30 LAB — ACETAMINOPHEN LEVEL: Acetaminophen: <2 ug/mL

## 2012-05-31 LAB — CBC WITH DIFFERENTIAL/PLATELET
Basophil %: 0.4 %
Eosinophil #: 0 10*3/uL (ref 0.0–0.7)
Eosinophil %: 0 %
Lymphocyte #: 0.5 10*3/uL — ABNORMAL LOW (ref 1.0–3.6)
Lymphocyte %: 6.7 %
MCH: 30.8 pg (ref 26.0–34.0)
MCHC: 33.1 g/dL (ref 32.0–36.0)
MCV: 93 fL (ref 80–100)
Monocyte #: 0.3 x10 3/mm (ref 0.2–1.0)
Neutrophil %: 88.9 %
Platelet: 155 10*3/uL (ref 150–440)
RBC: 3.05 10*6/uL — ABNORMAL LOW (ref 4.40–5.90)

## 2012-05-31 LAB — URINALYSIS, COMPLETE
Leukocyte Esterase: NEGATIVE
Nitrite: NEGATIVE
Ph: 6 (ref 4.5–8.0)
Protein: NEGATIVE
RBC,UR: <1 /HPF (ref 0–5)
Squamous Epithelial: <1

## 2012-05-31 LAB — HEPATIC FUNCTION PANEL A (ARMC)
Albumin: 2.8 g/dL — ABNORMAL LOW (ref 3.4–5.0)
Alkaline Phosphatase: 130 U/L (ref 50–136)
SGPT (ALT): 60 U/L (ref 12–78)

## 2012-05-31 LAB — DRUG SCREEN, URINE
Amphetamines, Ur Screen: NEGATIVE (ref ?–1000)
Cannabinoid 50 Ng, Ur ~~LOC~~: NEGATIVE (ref ?–50)
Cocaine Metabolite,Ur ~~LOC~~: NEGATIVE (ref ?–300)
Methadone, Ur Screen: NEGATIVE (ref ?–300)
Opiate, Ur Screen: NEGATIVE (ref ?–300)
Tricyclic, Ur Screen: NEGATIVE (ref ?–1000)

## 2012-05-31 LAB — BASIC METABOLIC PANEL
BUN: 16 mg/dL (ref 7–18)
Calcium, Total: 7.9 mg/dL — ABNORMAL LOW (ref 8.5–10.1)
Chloride: 96 mmol/L — ABNORMAL LOW (ref 98–107)
Co2: 28 mmol/L (ref 21–32)
EGFR (Non-African Amer.): >60
Glucose: 107 mg/dL — ABNORMAL HIGH (ref 65–99)
Osmolality: 272 (ref 275–301)
Potassium: 3.4 mmol/L — ABNORMAL LOW (ref 3.5–5.1)
Sodium: 135 mmol/L — ABNORMAL LOW (ref 136–145)

## 2012-06-01 LAB — COMPREHENSIVE METABOLIC PANEL
Albumin: 2.6 g/dL — ABNORMAL LOW (ref 3.4–5.0)
Bilirubin,Total: 1.4 mg/dL — ABNORMAL HIGH (ref 0.2–1.0)
Calcium, Total: 7.8 mg/dL — ABNORMAL LOW (ref 8.5–10.1)
Chloride: 102 mmol/L (ref 98–107)
Co2: 27 mmol/L (ref 21–32)
EGFR (Non-African Amer.): >60
Osmolality: 276 (ref 275–301)
Potassium: 3.3 mmol/L — ABNORMAL LOW (ref 3.5–5.1)
SGOT(AST): 86 U/L — ABNORMAL HIGH (ref 15–37)
SGPT (ALT): 53 U/L (ref 12–78)

## 2012-06-01 LAB — MAGNESIUM: Magnesium: 1.1 mg/dL — ABNORMAL LOW

## 2012-06-01 LAB — URINE CULTURE

## 2012-06-01 LAB — IRON AND TIBC
Iron Bind.Cap.(Total): 194 ug/dL — ABNORMAL LOW (ref 250–450)
Unbound Iron-Bind.Cap.: 109 ug/dL

## 2012-06-01 LAB — HEMOGLOBIN: HGB: 7.7 g/dL — ABNORMAL LOW (ref 13.0–18.0)

## 2012-06-01 LAB — FERRITIN: Ferritin (ARMC): 2019 ng/mL — ABNORMAL HIGH (ref 8–388)

## 2012-06-02 LAB — OCCULT BLOOD X 1 CARD TO LAB, STOOL: Occult Blood, Feces: NEGATIVE

## 2012-06-02 LAB — MAGNESIUM: Magnesium: 1.2 mg/dL — ABNORMAL LOW

## 2012-06-03 LAB — MAGNESIUM: Magnesium: 1.1 mg/dL — ABNORMAL LOW

## 2012-06-04 LAB — CBC WITH DIFFERENTIAL/PLATELET
Basophil #: 0 10*3/uL (ref 0.0–0.1)
Basophil %: 0.5 %
Eosinophil %: 0.7 %
HCT: 30.3 % — ABNORMAL LOW (ref 40.0–52.0)
HGB: 10.1 g/dL — ABNORMAL LOW (ref 13.0–18.0)
MCH: 32.1 pg (ref 26.0–34.0)
Monocyte %: 9.4 %
Neutrophil #: 4.8 10*3/uL (ref 1.4–6.5)
Neutrophil %: 67.4 %
RBC: 3.16 10*6/uL — ABNORMAL LOW (ref 4.40–5.90)
RDW: 14.8 % — ABNORMAL HIGH (ref 11.5–14.5)
WBC: 7.1 10*3/uL (ref 3.8–10.6)

## 2012-06-04 LAB — COMPREHENSIVE METABOLIC PANEL
Albumin: 2.9 g/dL — ABNORMAL LOW (ref 3.4–5.0)
Alkaline Phosphatase: 139 U/L — ABNORMAL HIGH (ref 50–136)
BUN: 18 mg/dL (ref 7–18)
Calcium, Total: 8.4 mg/dL — ABNORMAL LOW (ref 8.5–10.1)
Co2: 27 mmol/L (ref 21–32)
Creatinine: 1.05 mg/dL (ref 0.60–1.30)
EGFR (African American): >60
Osmolality: 269 (ref 275–301)
Sodium: 133 mmol/L — ABNORMAL LOW (ref 136–145)
Total Protein: 6.6 g/dL (ref 6.4–8.2)

## 2012-06-04 LAB — URINALYSIS, COMPLETE
Bilirubin,UR: NEGATIVE
Glucose,UR: NEGATIVE mg/dL (ref 0–75)
Hyaline Cast: 4
Ketone: NEGATIVE
Nitrite: NEGATIVE

## 2012-06-04 LAB — CK TOTAL AND CKMB (NOT AT ARMC)
CK, Total: 29 U/L — ABNORMAL LOW (ref 35–232)
CK-MB: <0.5 ng/mL — ABNORMAL LOW (ref 0.5–3.6)

## 2012-06-04 LAB — TROPONIN I
Troponin-I: <0.02 ng/mL
Troponin-I: <0.02 ng/mL

## 2012-06-04 LAB — MAGNESIUM: Magnesium: 2.5 mg/dL — ABNORMAL HIGH

## 2012-06-05 LAB — POTASSIUM: Potassium: 4 mmol/L (ref 3.5–5.1)

## 2012-06-05 LAB — SODIUM: Sodium: 134 mmol/L — ABNORMAL LOW (ref 136–145)

## 2012-06-05 LAB — HEPATIC FUNCTION PANEL A (ARMC)
Alkaline Phosphatase: 133 U/L (ref 50–136)
Bilirubin, Direct: 0.5 mg/dL — ABNORMAL HIGH (ref 0.00–0.20)
SGOT(AST): 112 U/L — ABNORMAL HIGH (ref 15–37)
SGPT (ALT): 142 U/L — ABNORMAL HIGH (ref 12–78)
Total Protein: 6.3 g/dL — ABNORMAL LOW (ref 6.4–8.2)

## 2012-06-05 LAB — MAGNESIUM: Magnesium: 1.8 mg/dL

## 2012-06-10 LAB — CULTURE, BLOOD (SINGLE)

## 2012-08-11 ENCOUNTER — Encounter: Payer: Self-pay | Admitting: Family Medicine

## 2014-01-19 IMAGING — CR DG CHEST 1V PORT
1 series · 1 of 1 positions shown · non-contrast
Comparison: none

REASON FOR EXAM: FEVER of unknown origin
COMMENTS:

[portable]
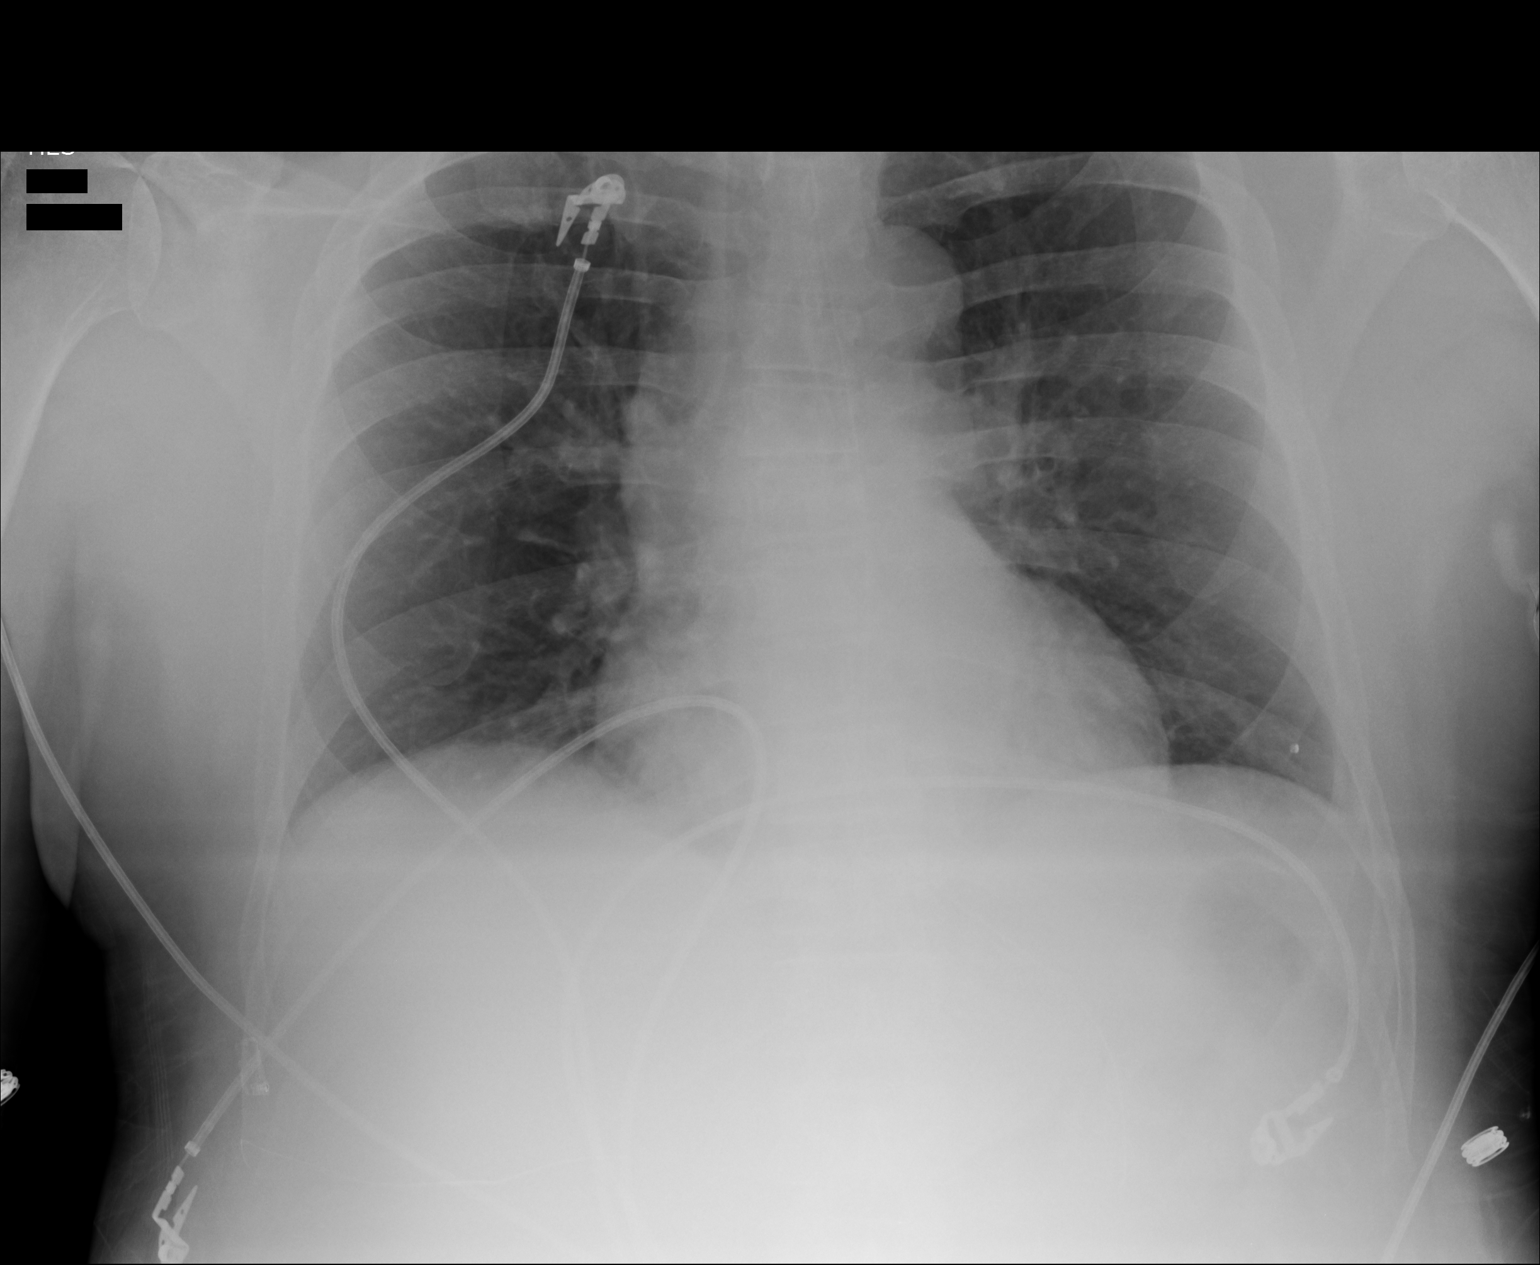

[1 of 1 positions shown; findings below may reference images not displayed]

PROCEDURE:     DXR - DXR PORTABLE CHEST SINGLE VIEW  - October 05, 2011  [DATE]

RESULT:     Comparison is made to the prior exam of 10/04/2011. The lung
fields are clear. No pneumonia, pneumothorax or pleural effusion is seen.
Heart size is normal. An endotracheal tube is present with the tip
approximately 5 cm above the carina.
IMPRESSION: 1. No acute changes are identified.
2. No pneumonia is seen.
3. An endotracheal tube is present with the tip in good position
approximately 5 cm above the carina.

## 2014-01-21 IMAGING — CR DG CHEST 1V PORT
1 series · 1 of 1 positions shown · non-contrast
Comparison: none

REASON FOR EXAM: fever
COMMENTS:

[portable]
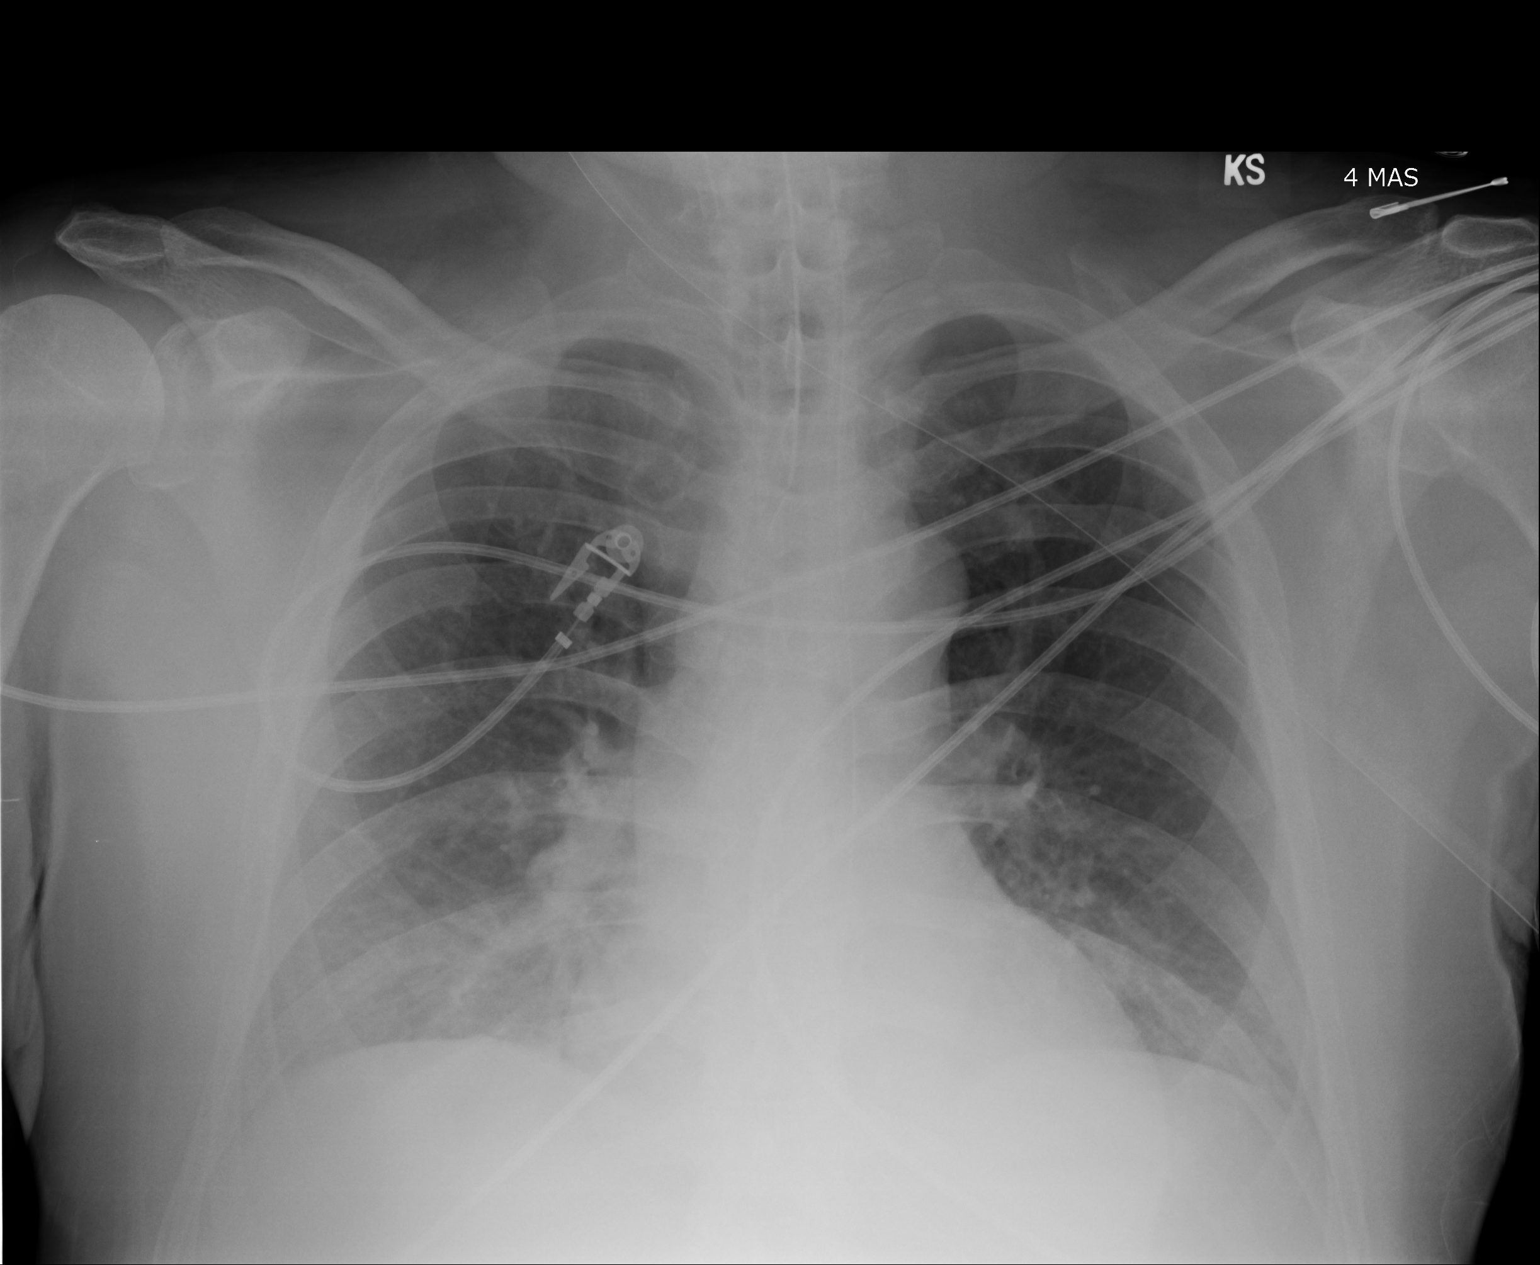

[1 of 1 positions shown; findings below may reference images not displayed]

PROCEDURE:     DXR - DXR PORTABLE CHEST SINGLE VIEW  - October 07, 2011  [DATE]

RESULT:     Comparison is made to the study October 05, 2011.

There is an endotracheal tube present whose tip lies at the level of the
inferior margin of the clavicular heads. The lungs are reasonably well
inflated. There are increased interstitial densities in the mid and lower
lungs bilaterally which are not new. The cardiac silhouette is not enlarged.
I see no pleural effusion.
IMPRESSION: There are increased interstitial densities in the mid and
lower lungs bilaterally which may reflect subsegmental atelectasis or
developing pneumonia. Followup films are recommended.

[REDACTED]

## 2014-01-22 IMAGING — CR DG CHEST 1V PORT
1 series · 1 of 1 positions shown · non-contrast
Comparison: none

REASON FOR EXAM: central line placement
COMMENTS:

[portable]
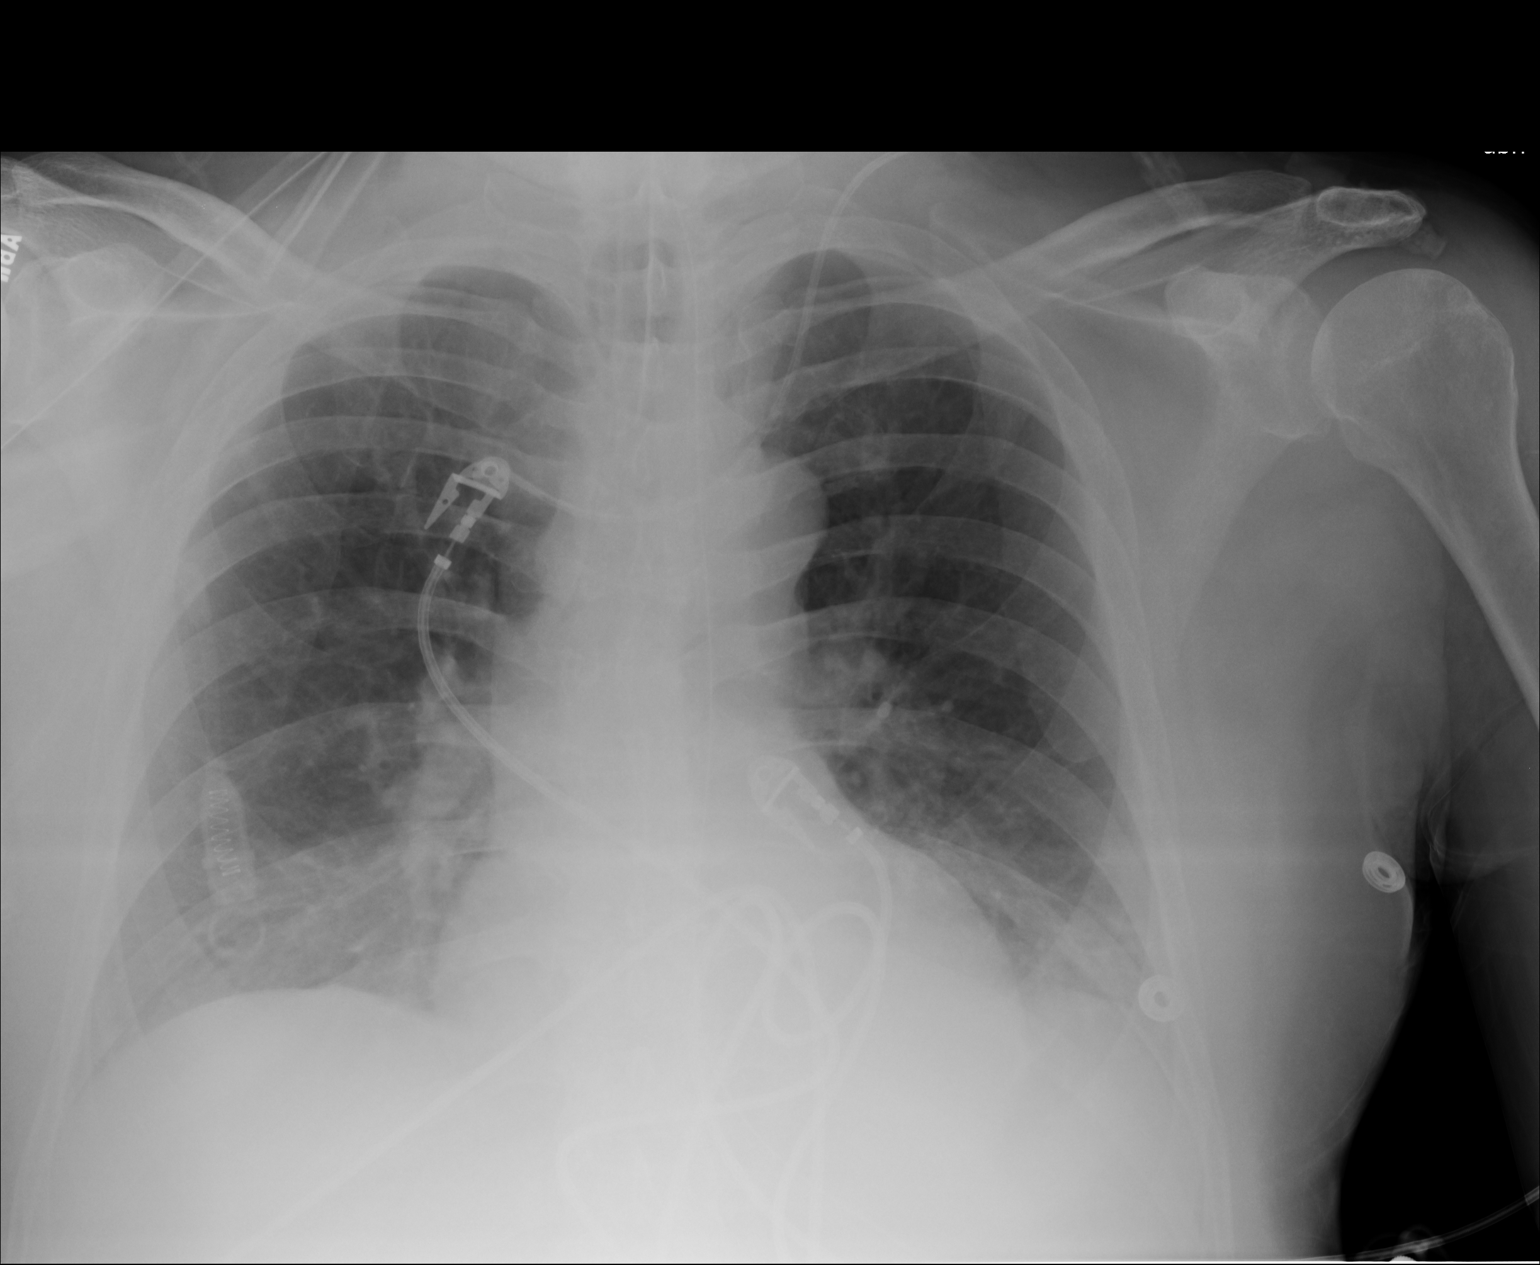

[1 of 1 positions shown; findings below may reference images not displayed]

PROCEDURE:     DXR - DXR PORTABLE CHEST SINGLE VIEW  - October 08, 2011 [DATE]

RESULT:

Frontal view of the chest is performed. Comparison is made to prior study
dated 10/07/2011.

The patient is status post left sided central venous catheter placement. The
tip of the catheter projects along the proximal aspect of the right
brachiocephalic vein. Retraction of approximately 1 cm is recommended. There
is no evidence of a pneumothorax. The chest radiograph is otherwise
unchanged.
IMPRESSION: Insertion of a left internal jugular central venous
catheter and retraction of approximately 1 cm is recommended.

## 2014-05-30 ENCOUNTER — Inpatient Hospital Stay: Payer: Self-pay | Admitting: Internal Medicine

## 2014-05-30 LAB — COMPREHENSIVE METABOLIC PANEL
ALT: 59 U/L
ANION GAP: 10 (ref 7–16)
Albumin: 3.1 g/dL — ABNORMAL LOW (ref 3.4–5.0)
Alkaline Phosphatase: 93 U/L
BILIRUBIN TOTAL: 1.4 mg/dL — AB (ref 0.2–1.0)
BUN: 36 mg/dL — ABNORMAL HIGH (ref 7–18)
CALCIUM: 9 mg/dL (ref 8.5–10.1)
Chloride: 116 mmol/L — ABNORMAL HIGH (ref 98–107)
Co2: 23 mmol/L (ref 21–32)
Creatinine: 1.37 mg/dL — ABNORMAL HIGH (ref 0.60–1.30)
GFR CALC NON AF AMER: 57 — AB
GLUCOSE: 127 mg/dL — AB (ref 65–99)
Osmolality: 306 (ref 275–301)
Potassium: 3.4 mmol/L — ABNORMAL LOW (ref 3.5–5.1)
SGOT(AST): 105 U/L — ABNORMAL HIGH (ref 15–37)
SODIUM: 149 mmol/L — AB (ref 136–145)
TOTAL PROTEIN: 8.1 g/dL (ref 6.4–8.2)

## 2014-05-30 LAB — PROTIME-INR
INR: 1.4
PROTHROMBIN TIME: 16.9 s — AB (ref 11.5–14.7)

## 2014-05-30 LAB — URINALYSIS, COMPLETE
Bacteria: NONE SEEN
Bilirubin,UR: NEGATIVE
GLUCOSE, UR: NEGATIVE mg/dL (ref 0–75)
Ketone: NEGATIVE
Leukocyte Esterase: NEGATIVE
NITRITE: NEGATIVE
Ph: 5 (ref 4.5–8.0)
RBC,UR: 1 /HPF (ref 0–5)
Specific Gravity: 1.027 (ref 1.003–1.030)
Squamous Epithelial: <1

## 2014-05-30 LAB — CBC
HCT: 44 % (ref 40.0–52.0)
HGB: 14.3 g/dL (ref 13.0–18.0)
MCH: 32.1 pg (ref 26.0–34.0)
MCHC: 32.6 g/dL (ref 32.0–36.0)
MCV: 99 fL (ref 80–100)
PLATELETS: 240 10*3/uL (ref 150–440)
RBC: 4.47 10*6/uL (ref 4.40–5.90)
RDW: 17.8 % — AB (ref 11.5–14.5)
WBC: 18.9 10*3/uL — AB (ref 3.8–10.6)

## 2014-05-30 LAB — ACETAMINOPHEN LEVEL: Acetaminophen: <2 ug/mL

## 2014-05-30 LAB — DRUG SCREEN, URINE
AMPHETAMINES, UR SCREEN: NEGATIVE (ref ?–1000)
Barbiturates, Ur Screen: NEGATIVE (ref ?–200)
Benzodiazepine, Ur Scrn: NEGATIVE (ref ?–200)
Cannabinoid 50 Ng, Ur ~~LOC~~: NEGATIVE (ref ?–50)
Cocaine Metabolite,Ur ~~LOC~~: NEGATIVE (ref ?–300)
MDMA (ECSTASY) UR SCREEN: NEGATIVE (ref ?–500)
METHADONE, UR SCREEN: NEGATIVE (ref ?–300)
OPIATE, UR SCREEN: NEGATIVE (ref ?–300)
PHENCYCLIDINE (PCP) UR S: NEGATIVE (ref ?–25)
TRICYCLIC, UR SCREEN: NEGATIVE (ref ?–1000)

## 2014-05-30 LAB — CK TOTAL AND CKMB (NOT AT ARMC)
CK, Total: 1146 U/L — ABNORMAL HIGH (ref 39–308)
CK-MB: 3.4 ng/mL (ref 0.5–3.6)

## 2014-05-30 LAB — ETHANOL: Ethanol: <3 mg/dL

## 2014-05-30 LAB — TROPONIN I: TROPONIN-I: 0.03 ng/mL

## 2014-05-30 LAB — SALICYLATE LEVEL: Salicylates, Serum: 2.4 mg/dL

## 2014-05-31 LAB — TROPONIN I
Troponin-I: 0.03 ng/mL
Troponin-I: 0.03 ng/mL

## 2014-05-31 LAB — CBC WITH DIFFERENTIAL/PLATELET
Basophil #: 0.1 10*3/uL (ref 0.0–0.1)
Basophil %: 1 %
Eosinophil #: 0 10*3/uL (ref 0.0–0.7)
Eosinophil %: 0.1 %
HCT: 41 % (ref 40.0–52.0)
HGB: 13.1 g/dL (ref 13.0–18.0)
LYMPHS ABS: 1.7 10*3/uL (ref 1.0–3.6)
LYMPHS PCT: 11.9 %
MCH: 32 pg (ref 26.0–34.0)
MCHC: 31.9 g/dL — AB (ref 32.0–36.0)
MCV: 100 fL (ref 80–100)
MONO ABS: 1.3 x10 3/mm — AB (ref 0.2–1.0)
MONOS PCT: 9.2 %
NEUTROS ABS: 11.3 10*3/uL — AB (ref 1.4–6.5)
NEUTROS PCT: 77.8 %
PLATELETS: 170 10*3/uL (ref 150–440)
RBC: 4.09 10*6/uL — AB (ref 4.40–5.90)
RDW: 18.4 % — ABNORMAL HIGH (ref 11.5–14.5)
WBC: 14.5 10*3/uL — AB (ref 3.8–10.6)

## 2014-05-31 LAB — COMPREHENSIVE METABOLIC PANEL
ALBUMIN: 2.5 g/dL — AB (ref 3.4–5.0)
Alkaline Phosphatase: 75 U/L
Anion Gap: 6 — ABNORMAL LOW (ref 7–16)
BUN: 29 mg/dL — ABNORMAL HIGH (ref 7–18)
Bilirubin,Total: 1.1 mg/dL — ABNORMAL HIGH (ref 0.2–1.0)
CALCIUM: 8.6 mg/dL (ref 8.5–10.1)
Chloride: 120 mmol/L — ABNORMAL HIGH (ref 98–107)
Co2: 25 mmol/L (ref 21–32)
Creatinine: 1.1 mg/dL (ref 0.60–1.30)
Glucose: 109 mg/dL — ABNORMAL HIGH (ref 65–99)
Osmolality: 306 (ref 275–301)
Potassium: 3.3 mmol/L — ABNORMAL LOW (ref 3.5–5.1)
SGOT(AST): 86 U/L — ABNORMAL HIGH (ref 15–37)
SGPT (ALT): 48 U/L
SODIUM: 151 mmol/L — AB (ref 136–145)
Total Protein: 6.8 g/dL (ref 6.4–8.2)

## 2014-05-31 LAB — CK TOTAL AND CKMB (NOT AT ARMC)
CK, TOTAL: 1104 U/L — AB (ref 39–308)
CK, Total: 955 U/L — ABNORMAL HIGH (ref 39–308)
CK-MB: 2.9 ng/mL (ref 0.5–3.6)
CK-MB: 3.2 ng/mL (ref 0.5–3.6)

## 2014-05-31 LAB — T4, FREE: Free Thyroxine: 1.08 ng/dL (ref 0.76–1.46)

## 2014-05-31 LAB — MAGNESIUM: Magnesium: 1.9 mg/dL

## 2014-05-31 LAB — TSH: THYROID STIMULATING HORM: 0.32 u[IU]/mL — AB

## 2014-06-01 LAB — BASIC METABOLIC PANEL
Anion Gap: 5 — ABNORMAL LOW (ref 7–16)
BUN: 18 mg/dL (ref 7–18)
CO2: 28 mmol/L (ref 21–32)
CREATININE: 0.98 mg/dL (ref 0.60–1.30)
Calcium, Total: 8.5 mg/dL (ref 8.5–10.1)
Chloride: 116 mmol/L — ABNORMAL HIGH (ref 98–107)
EGFR (African American): >60
EGFR (Non-African Amer.): >60
GLUCOSE: 104 mg/dL — AB (ref 65–99)
Osmolality: 298 (ref 275–301)
POTASSIUM: 3.3 mmol/L — AB (ref 3.5–5.1)
Sodium: 149 mmol/L — ABNORMAL HIGH (ref 136–145)

## 2014-06-01 LAB — CBC WITH DIFFERENTIAL/PLATELET
Basophil #: 0.1 10*3/uL (ref 0.0–0.1)
Basophil %: 0.8 %
EOS ABS: 0.2 10*3/uL (ref 0.0–0.7)
EOS PCT: 2.1 %
HCT: 37.4 % — AB (ref 40.0–52.0)
HGB: 12.1 g/dL — ABNORMAL LOW (ref 13.0–18.0)
Lymphocyte #: 1.6 10*3/uL (ref 1.0–3.6)
Lymphocyte %: 15.3 %
MCH: 32.8 pg (ref 26.0–34.0)
MCHC: 32.4 g/dL (ref 32.0–36.0)
MCV: 101 fL — ABNORMAL HIGH (ref 80–100)
MONO ABS: 0.9 x10 3/mm (ref 0.2–1.0)
Monocyte %: 8.9 %
Neutrophil #: 7.8 10*3/uL — ABNORMAL HIGH (ref 1.4–6.5)
Neutrophil %: 72.9 %
Platelet: 147 10*3/uL — ABNORMAL LOW (ref 150–440)
RBC: 3.7 10*6/uL — AB (ref 4.40–5.90)
RDW: 18.9 % — AB (ref 11.5–14.5)
WBC: 10.6 10*3/uL (ref 3.8–10.6)

## 2014-06-01 LAB — CK: CK, Total: 513 U/L — ABNORMAL HIGH (ref 39–308)

## 2014-06-01 LAB — VANCOMYCIN, TROUGH: Vancomycin, Trough: 13 ug/mL (ref 10–20)

## 2014-06-02 LAB — CBC WITH DIFFERENTIAL/PLATELET
BASOS ABS: 0.1 10*3/uL (ref 0.0–0.1)
Basophil %: 0.7 %
EOS PCT: 3.4 %
Eosinophil #: 0.3 10*3/uL (ref 0.0–0.7)
HCT: 37.8 % — ABNORMAL LOW (ref 40.0–52.0)
HGB: 12.1 g/dL — AB (ref 13.0–18.0)
LYMPHS PCT: 20 %
Lymphocyte #: 1.8 10*3/uL (ref 1.0–3.6)
MCH: 32.4 pg (ref 26.0–34.0)
MCHC: 32.1 g/dL (ref 32.0–36.0)
MCV: 101 fL — ABNORMAL HIGH (ref 80–100)
MONOS PCT: 10.9 %
Monocyte #: 1 x10 3/mm (ref 0.2–1.0)
NEUTROS ABS: 6 10*3/uL (ref 1.4–6.5)
Neutrophil %: 65 %
Platelet: 151 10*3/uL (ref 150–440)
RBC: 3.74 10*6/uL — ABNORMAL LOW (ref 4.40–5.90)
RDW: 18.9 % — ABNORMAL HIGH (ref 11.5–14.5)
WBC: 9.2 10*3/uL (ref 3.8–10.6)

## 2014-06-02 LAB — BASIC METABOLIC PANEL
ANION GAP: 6 — AB (ref 7–16)
BUN: 13 mg/dL (ref 7–18)
CHLORIDE: 112 mmol/L — AB (ref 98–107)
CREATININE: 0.88 mg/dL (ref 0.60–1.30)
Calcium, Total: 8.5 mg/dL (ref 8.5–10.1)
Co2: 28 mmol/L (ref 21–32)
Glucose: 99 mg/dL (ref 65–99)
OSMOLALITY: 291 (ref 275–301)
Potassium: 3.4 mmol/L — ABNORMAL LOW (ref 3.5–5.1)
Sodium: 146 mmol/L — ABNORMAL HIGH (ref 136–145)

## 2014-06-02 LAB — CK: CK, Total: 305 U/L (ref 39–308)

## 2014-06-04 LAB — MAGNESIUM: MAGNESIUM: 1.2 mg/dL — AB

## 2014-06-05 LAB — BASIC METABOLIC PANEL
Anion Gap: 5 — ABNORMAL LOW (ref 7–16)
BUN: 10 mg/dL (ref 7–18)
CHLORIDE: 107 mmol/L (ref 98–107)
CREATININE: 0.8 mg/dL (ref 0.60–1.30)
Calcium, Total: 8.5 mg/dL (ref 8.5–10.1)
Co2: 29 mmol/L (ref 21–32)
EGFR (African American): >60
EGFR (Non-African Amer.): >60
Glucose: 99 mg/dL (ref 65–99)
Osmolality: 280 (ref 275–301)
Potassium: 3.3 mmol/L — ABNORMAL LOW (ref 3.5–5.1)
SODIUM: 141 mmol/L (ref 136–145)

## 2014-06-05 LAB — CULTURE, BLOOD (SINGLE)

## 2014-06-05 LAB — MAGNESIUM: Magnesium: 1.5 mg/dL — ABNORMAL LOW

## 2014-06-06 LAB — CREATININE, SERUM
Creatinine: 0.7 mg/dL (ref 0.60–1.30)
EGFR (African American): >60
EGFR (Non-African Amer.): >60

## 2014-06-07 DIAGNOSIS — R000 Tachycardia, unspecified: Secondary | ICD-10-CM

## 2014-06-07 LAB — POTASSIUM: Potassium: 4.3 mmol/L (ref 3.5–5.1)

## 2014-06-07 LAB — MAGNESIUM: Magnesium: 1.8 mg/dL

## 2014-06-07 LAB — AMMONIA: Ammonia, Plasma: 39 mcmol/L — ABNORMAL HIGH (ref 11–32)

## 2014-06-08 LAB — POTASSIUM: POTASSIUM: 4.5 mmol/L (ref 3.5–5.1)

## 2014-09-21 NOTE — H&P (Signed)
PATIENT NAME:  Johnny [name], Johnny [name] MR#:  [id number] DATE OF BIRTH:  [birth year]  DATE OF ADMISSION:  05/30/2012  PRIMARY CARE PHYSICIAN: Does not have one.   CHIEF COMPLAINT:  1.  Altered mental status.  2.  Supraventricular tachycardia.   HISTORY OF PRESENT ILLNESS: This is [name] 59 year old male who was brought into the hospital via EMS due to altered mental status and also complaining of rapid heart rate. The patient presently is somewhat confused, therefore, difficult to obtain [name] good history from the patient.  Most of the history obtained from the chart. I attempted to contact some family members, but was not able to reach them, therefore the history is limited. The patient apparently was hospitalized here at [facility] about six months ago and was in the hospital for about [name] month. He came in with alcohol withdrawal and developed acute respiratory failure, had to be intubated and was sent to rehab, status post tracheostomy. As per the patient he says he is supposed to be on medications, but has not been taking anything. He presented with altered mental status and was noted to be in supraventricular tachycardia.  He received some IV Cardizem via EMS, which seemed to slow his heart rate down. The patient was noted to be severely hypokalemic, noted to be in acute renal failure and hyponatremic. Hospitalist service was called for further treatment and evaluation.   REVIEW OF SYSTEMS: Otherwise unobtainable, given the patient's mental status.   PAST MEDICAL HISTORY:  1.  Alcohol abuse. 2.  Ongoing tobacco abuse. 3.  History of respiratory failure status post tracheostomy. 4.  History of MRSA pneumonia. 5.  History of seizure disorder. 6.  History of hypertension.   ALLERGIES: No known drug allergies.   SOCIAL HISTORY: Does smoke about [name] pack per day. Also drinks heavily, mostly beer.  Cannot tell me how much. Last drink apparently being within the past day or so. No other illicit drug abuse.    FAMILY HISTORY: Unobtainable.   CURRENT MEDICATIONS: The patient is currently not taking any medications.   PHYSICAL EXAMINATION ON ADMISSION:  VITAL SIGNS: Temperature 96.8, pulse 97, respirations 20, blood pressure 145/93, sats 96% on room air.  GENERAL: He is [name] pleasant appearing male, lethargic, but in no apparent distress.  HEENT: Atraumatic, normocephalic. Extraocular muscles are intact. Pupils equal, reactive to light. Sclerae anicteric. No conjunctival injection. No pharyngeal erythema.  NECK: Supple. No jugulovenous distention, no bruits, no lymphadenopathy, no thyromegaly.  HEART: Regular rate and rhythm. No murmurs, rubs, or clicks.  LUNGS: He has some coarse rhonchi and wheezing diffusely.  Negative use of accessory muscles. No dullness to percussion.  ABDOMEN: Soft, flat, nontender, nondistended. Has good bowel sounds. No hepatosplenomegaly appreciated.  EXTREMITIES: No evidence of any cyanosis, clubbing, or peripheral edema. Has +2 pedal and radial pulses, bilaterally.  NEUROLOGICAL: The patient is alert, awake and oriented x 2. Moves all extremities spontaneously. No other focal motor or sensory deficits appreciated, bilaterally.  SKIN: Moist and warm with no rash appreciated.  LYMPHATIC: There is no cervical or axillary lymphadenopathy.   DIAGNOSTIC DATA: Serum glucose 132, BUN 18, creatinine 1.3, sodium 128, potassium 2.6, chloride 84, bicarbonate 26.  LFTs, AST 147, ALT 74, albumin 3.4. Troponin 0.02, TSH of 0.9, hemoglobin 11.7, hematocrit 34.9, platelet count 200, white cell count 11.8.   The patient did have [name] CT of the head done without contrast showing no acute intracranial process. Chest x-ray done, which showed no evidence of acute cardiopulmonary disease.  ASSESSMENT AND PLAN: This is [name] 59 year old male with [name] history of alcohol abuse, ongoing tobacco abuse, history of respiratory failure, status post tracheostomy, history of methicillin-resistant Staphylococcus  aureus pneumonia and seizure disorder, who presents to the hospital due to altered mental status and noted to be in supraventricular tachycardia.  Also noted to have significant electrolyte abnormalities and also in alcohol withdrawal.  1.  Altered mental status. This is likely related to his alcohol abuse/withdrawal. His CT of the head is negative. No other focal neurological abnormality.  There is no evidence of any acute focal metabolic abnormality. I do not appreciate any hepatic encephalopathy. His ammonia is mildly elevated only. For now I will place him on CIWA protocol for his alcohol withdrawal and follow his mental status.  2.  Hypokalemia. This is also probably related to dehydration and poor p.o. intake and alcohol abuse. I will go ahead and replace his potassium accordingly and follow. His magnesium level was also low, which is also currently being replaced.  3.  Hyponatremia. This is hypotonic hypovolemic hyponatremia related to his alcohol abuse. I will go ahead and hydrate him with IV fluids and follow his sodium.  4.  Acute renal failure, probably related to dehydration and poor p.o. intake. We will hydrate him with IV fluids and follow BUN and creatinine and urine output. Renal dose medications. Avoid nephrotoxins. 5.  Tobacco abuse. Place him on [name] nicotine patch.  6.  History of chronic obstructive pulmonary disease with ongoing tobacco abuse. He has significant wheezing and rhonchi, bilaterally. May be in mild chronic obstructive pulmonary disease exacerbation.  I will place him on IV steroids, around-the-clock nebulizer treatments and also start some Advair.  7.  History of methicillin-resistant Staphylococcus aureus pneumonia. Place him on contact isolation.   CODE STATUS: The patient is [name] full code.   Time spent on admission: 50 minutes   ____________________________ Rolly Pancake. Cherlynn Kaiser, MD vjs:eg D: 05/30/2012 18:33:16 ET T: 05/30/2012 20:11:58 ET JOB#: [id number]  cc: Rolly Pancake.  Cherlynn Kaiser, MD, <Dictator> [place] Siren MD ELECTRONICALLY SIGNED 06/02/2012 8:21

## 2014-09-24 NOTE — Consult Note (Signed)
Brief Consult Note: Diagnosis: delirium - multiple factors possibly DT, currently improved.   Patient was seen by consultant.   Consult note dictated.   Recommend further assessment or treatment.   Comments: Psychiatry: Patient seen. Today he is awake and alert and interactive and actually oriented and has some reasonable short term memory. Seems improved over how he has been up to today. Patient still not able/willing to give a good history of his alcohol use. He does admit he had not been eating well for perhaps a couple weeks prior to admit. No change to acute treatment. I suggest adult services check on this guy's home to see if it is safe. Will follow.  Electronic Signatures: Audery Amellapacs, Clyde Zarrella T (MD)  (Signed 02-Jan-14 16:28)  Authored: Brief Consult Note   Last Updated: 02-Jan-14 16:28 by Audery Amellapacs, Katiana Ruland T (MD)

## 2014-09-24 NOTE — Discharge Summary (Signed)
PATIENT NAME:  Johnny [name], Johnny [name] MR#:  [id number] DATE OF BIRTH:  1956-03-24  DATE OF ADMISSION:  05/30/2012 DATE OF DISCHARGE:  06/10/2012  NOTE: Again, please take a look at the interim discharge summary done by Dr. Winona Legato, which covers the hospital course from December 27 until January 3. This covers the hospital course from January 4 onwards.   DISCHARGE DIAGNOSES: Altered mental status, likely secondary to alcohol withdrawal. Hepatic encephalopathy, now resolved. Delirium tremens, also now resolved. Chronic obstructive pulmonary disease exacerbation, also much improved. Hyponatremia, resolved. Acute renal failure, also resolved. 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 Lactulose-induced diarrhea.   DIET: The patient is being discharged on a low-sodium diet.   ACTIVITY: As tolerated.   DISPOSITION: The patient is going to follow up with [facility] in the next 1 to 2 weeks.   DISCHARGE MEDICATIONS: Protonix 40 mg daily, thiamine 100 mg daily, folic acid 1 mg daily, Norvasc 10 mg daily, Advair 250/50 one puff b.i.d. and a prednisone taper with only 2 days left, 20 mg x 1 day and then 10 mg x 1 day.   INTERIM STUDIES DONE: Nuclear medicine myocardial scan showing no evidence of any acute myocardial ischemia.   HOSPITAL COURSE: This is a 59 year old male with multiple medical problems as mentioned above who presented to the hospital recently on 05/30/2012 secondary to altered mental status secondary to alcohol abuse/withdrawal.  1.  Altered mental status. In the interim since January 4, the patient's mental status has been pretty much back to baseline. He has had an extensive workup for his altered mental status while being in the hospital including a CT head and MRI brain, which were negative. The likely cause of this was probably alcohol withdrawal. Since he has finished his detox and withdrawal, his mental status is now back to baseline. There  was no other evidence of any focal or metabolic or infectious etiology.  2.  Acute respiratory failure. This was likely secondary to a COPD exacerbation. It has significantly improved since admission. His CT scan showed no evidence of any pulmonary emboli. His chest x-ray did not show any acute pneumonia. He currently is on Advair, which he will continue, and finish his prednisone taper as stated.  3.  Hypotension. This was likely secondary to lactulose-induced diarrhea. Since his lactulose has been suspended and his diarrhea improved, his hypotension has since then resolved. He did have some atypical chest pain, therefore underwent a myocardial stress test in the interim, which was essentially normal.  4.  Sinus tachycardia. This was probably related to the alcohol withdrawal. It has since then improved and resolved. His cardiac markers x 3 were negative. He is currently in a sinus rhythm and asymptomatic.  5.  Abnormal liver function tests. This is likely secondary to alcoholic hepatitis. He has had an abdominal ultrasound, which has been benign. His HIDA scan has been normal. He has no abdominal pain, no nausea and vomiting. This can be further followed up as an outpatient.  6.  COPD, likely secondary to ongoing tobacco abuse. The patient presently is on Advair, which he will continue along and finish his prednisone taper as stated.   CODE STATUS: The patient is a full code.   DISPOSITION: He is being discharged home with home health services. The patient was also seen by psychiatry and they did not think that the patient needed any inpatient psychiatric services. He was stable from their standpoint to be discharged home.  TIME SPENT WITH DISCHARGE: 40 minutes.    ____________________________ Rolly PancakeVivek J. Cherlynn KaiserSainani, MD vjs:jm D: 06/10/2012 16:23:49 ET T: 06/10/2012 21:52:14 ET JOB#: [id number]  cc: Rolly PancakeVivek J. Cherlynn KaiserSainani, MD, <Dictator> [facility] [place] SirenVIVEK J SAINANI MD ELECTRONICALLY SIGNED  06/11/2012 14:07

## 2014-09-24 NOTE — Consult Note (Signed)
Details:    - Psychiatry: Came by to see patient. He is awake and alert and able to describe an understanding of his medical conditions. Able to discuss risk factors involved in his decision to go home. Thoughts seem to be clear. Affect euthymic. Patient presents as able to make appropriate medical decisions in general. No treatment suggestions. He still minimizes or avoids discussion of his substance abuse.   Electronic Signatures: Audery Amellapacs, Kaleigh Spiegelman T (MD)  (Signed 06-Jan-14 17:04)  Authored: Details   Last Updated: 06-Jan-14 17:04 by Audery Amellapacs, Norah Fick T (MD)

## 2014-09-24 NOTE — Consult Note (Signed)
Details:    - Psychiatry: Came by to followup with the patient again today. He appears to be an even better spirits and more mentally intact. He was alert and oriented x4. Able to hold a lucid conversation. Short-term memory was generally intact. He reports his mood is feeling good. On the other hand he continues to insist that he really doesn't know how he came to be in the state that he was in. When I ask him about drinking he became rather defensive and tends to become very vague and minimize his alcohol use. I don't think we will be able to prove it but I still suspect that alcohol has played a part in his deterioration and this particular incident. His insight about this his poor and he is not accepting of any substance abuse treatment directly. I spent some time indirectly giving him some education about the dangers of ongoing alcohol abuse. No need for any other psychiatric treatment at this time.   Electronic Signatures: Audery Amellapacs, Raeana Blinn T (MD)  (Signed 05-Jan-14 00:02)  Authored: Details   Last Updated: 05-Jan-14 00:02 by Audery Amellapacs, Hailyn Zarr T (MD)

## 2014-09-25 NOTE — H&P (Signed)
PATIENT NAME:  Johnny [name], Johnny [name] MR#:  [id number] DATE OF BIRTH:  July 16, 1955  DATE OF ADMISSION:  05/30/2014  CHIEF COMPLAINT: Altered mental status.   HISTORY OF PRESENT ILLNESS: Johnny [name] is a 59 year old male with history of heavy alcohol abuse, seizures, who last talked to his family members on 05/26/2014. Since then, the patient's family had been trying to contact the patient and was unable to get hold of him.  Concerning this, called the police, who went, and the patient was found to be unresponsive in the house lying on the right side of the body. The patient was covered with fecal material and urine, completely unresponsive. Workup in the Emergency Department: CT head without contrast was unremarkable. The patient was found to have a fever of 99, elevated white blood cell count of 18,000. Unable to obtain any history from the patient secondary to altered mental status. The patient received vancomycin and Zosyn in the Emergency Department. The patient was found to have multiple lacerations on the right side of the body, on the right cheek, right shoulder, and the right hip. Also, per nursing staff, the patient's house was filled with a lot of empty bottles of alcohol. Per family, who spoke with the Emergency Department physician, stated that the patient has been depressed since the patient's wife's death. He had a similar episode in 2014. At that time, the patient underwent alcohol withdrawal. The patient does not have any response even to painful stimuli.   PAST MEDICAL HISTORY:  1.  Hypertension.  2.  Hyperlipidemia.  3.  History of seizures.  4.  Heavy alcohol abuse.   PAST SURGICAL HISTORY:  1.  Tonsillectomy.  2.  History of some arrhythmias requiring ablation.   ALLERGIES: No known drug allergies.   HOME MEDICATIONS:  1.  Thiamine 100 mg daily.  2.  Protonix 40 mg daily.  3.  Prednisone.  4.  Norvasc 10 mg once a day.  5.  Folic acid 1 mg once a day.   SOCIAL HISTORY: Unable to  obtain from the patient.   FAMILY HISTORY: As per previous records, father had prostate cancer, grandmother with some male cancer.   REVIEW OF SYSTEMS: Could not be obtained secondary to altered mental status.   PHYSICAL EXAMINATION:  GENERAL: This is a  male lying down in the bed, not in distress, not responsive to any painful stimuli, raising the right arm, grimaces.  VITAL SIGNS: Temperature 99.5, pulse 118, blood pressure 140/104, respiratory rate of 13, oxygen saturation is 90% on 2 liters of oxygen.  HEENT: Head normocephalic. He has bruises on the right cheek. Pupils equal and react to light. Could not examine the mucous membranes.  NECK: Supple. No lymphadenopathy. No JVD. No carotid bruit.  CHEST: No focal tenderness. Decreased breath sounds in the lower lobes.  HEART: S1, S2 regular, tachycardia.  ABDOMEN: Bowel sounds present. Soft, nontender, nondistended.  EXTREMITIES: Right hip has a large bruise. Pulses 2+.  SKIN: He has multiple lacerations and bruises from lying down on the floor on the right side of the body.  NEUROLOGICAL: The patient is not oriented to place, person, and time. Could not examine the cranial nerves, motor, or sensory.   LABORATORY DATA: CMP: BUN 36, creatinine of 1.37, potassium 3.4. Bilirubin 1.4. CPK 1146. PT 17, INR of 1.4. CBC: WBC of 18.94, hemoglobin 14.3, platelet count of 240,000. ABG, pH of 4.6, pCO2 of 32, PaO2 of 75. Lactic acid of 2.9. Chest x-ray, one view, portable: No  acute cardiopulmonary disease.   X-ray of the pelvis: No acute fractures.   CT head without contrast: No acute findings of the head or cervical spine. Cortical atrophy advanced for age, multilevel cervical spondylosis.   X-ray of the shoulder: Negative for any fracture.   ASSESSMENT AND PLAN: Johnny [name] is a 59 year old with a history of heavy alcohol use and a seizure disorder, who was found to be unresponsive at home.  1.  Altered mental status: Differential diagnosis  seizures with a postictal period. Could not tell if the patient has any meningeal signs. We will keep the patient on vancomycin and Rocephin. If the patient continues to be unresponsive consider getting a lumbar puncture. The other possibility of stroke. We will also obtain MRI of the brain.  2.  Heavy alcohol abuse: Keep the patient on thiamine. Keep the patient on CIWA protocol.  3.  Dysphagia: Continue the intravenous fluids with D5 normal saline.  4.  Acute renal insufficiency secondary to rhabdomyolysis and dehydration: Continue with intravenous fluids.  5.  Rhabdomyolysis: Continue with intravenous fluids.  6.  Hypokalemia: We will also check the magnesium level. He is a high risk for multiple electrolyte imbalances. We will replace as needed.  7.  Sepsis: Source of the infection pneumonia versus meningitis. As mentioned above, we will keep the patient on Rocephin and vancomycin.  8.  Keep the patient on deep vein thrombosis prophylaxis with Lovenox.   TIME SPENT: 60 minutes.    ____________________________ Susa GriffinsPadmaja Marcoantonio Legault, MD pv:ts D: 05/30/2014 23:21:41 ET T: 05/31/2014 00:14:14 ET JOB#: [id number]  cc: Susa GriffinsPadmaja Wanya Bangura, MD, <Dictator> Susa GriffinsPADMAJA Haile Bosler MD ELECTRONICALLY SIGNED 05/31/2014 22:17

## 2014-09-26 NOTE — Consult Note (Signed)
Chief Complaint:   Subjective/Chief Complaint Overall same. Spiking fever.   VITAL SIGNS/ANCILLARY NOTES: **Vital Signs.:   19-May-13 10:00   Vital Signs Type Routine   Temperature Temperature (F) 100   Celsius 37.7   Temperature Source rectal   Pulse Pulse 88   Respirations Respirations 14   Systolic BP Systolic BP 132   Diastolic BP (mmHg) Diastolic BP (mmHg) 60   Mean BP 84   Pulse Ox % Pulse Ox % 94   Pulse Ox Activity Level  At rest   Oxygen Delivery Ventilator Assisted   Pulse Ox Heart Rate 86   Nurse Fingerstick (mg/dL) FSBS (fasting range 24-4065-99 mg/dL) 102116   Comments/Interventions  Per Hyperglycemia Protocol ([facility])   Brief Assessment:   Additional Physical Exam Sedated. Abdomen is soft and benign. Bowel sounds are very sluggish.   Assessment/Plan:  Assessment/Plan:   Assessment Patient with probable ileus. Need for an access for feeding. Fever.    Plan Will discuss with family regarding PEG. Will prefer him to be afebrile prior to any surgical procedures unless the fever is from a non-infectious process. Will follow.   Electronic Signatures: Lurline DelIftikhar, Consuello Lassalle (MD)  (Signed [id number] 260419-May-13 10:52)  Authored: Chief Complaint, VITAL SIGNS/ANCILLARY NOTES, Brief Assessment, Assessment/Plan   Last Updated: 19-May-13 10:52 by Lurline DelIftikhar, Mattison Stuckey (MD)

## 2014-09-26 NOTE — Consult Note (Signed)
PATIENT NAME:  Johnny [name], MCCARRICK MR#:  [id number] DATE OF BIRTH:  1955-09-14  DATE OF CONSULTATION:  10/22/2011  REFERRING PHYSICIAN:  Enid Baas, MD  CONSULTING PHYSICIAN:  Rosalyn Gess. Elba Dendinger, MD  REASON FOR CONSULTATION: Fever and leukocytosis.   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 Per the History and Physical, the patient had apparently been admitted to [facility] for alcohol abuse and hypertension but was then discharged. He was brought to the Emergency Room by a friend because the patient was confused and agitated, and he was subsequently admitted. In the Emergency Room, there was concern for his ability to protect his airway and he was intubated. Per the notes, it was a traumatic intubation. The patient has remained intubated during his hospital course and has subsequently undergone tracheostomy placement. During his hospital course, he was on the CIWA  program; however, every time his sedation had been lightened he got extremely agitated, and he has not been able to be removed from the ventilator. He is currently breathing spontaneously, however, and only requiring 28% FiO2. He is unable to provide any history, and the history is obtained exclusively from the chart. During his hospital course he was on Zosyn initially, vancomycin was added; the Zosyn was subsequently discontinued, and he has been continued on vancomycin, and Levaquin was recently added over the last few days. Despite this, he has continued to have intermittent fevers as high as 103.1. His white count has remained elevated. Cultures have not revealed an obvious etiology.    ALLERGIES: Keppra.   PAST MEDICAL HISTORY:  1. Hypertension.  2. 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  3. Hypercholesterolemia.   SOCIAL HISTORY: Unable to obtain from the patient although the history indicates that  he drinks alcohol regularly.   FAMILY HISTORY: Unable to obtain from the patient.   REVIEW OF SYSTEMS: Unable to obtain from the patient.  PHYSICAL EXAMINATION:  VITAL SIGNS: T-max of 103.1, T-current of 101.4, pulse 78, blood pressure 123/68, 97% on the ventilator. Vent settings include pressure support of 12, PEEP of 5, spontaneous rate 15, tidal volume of 606, minute ventilation of 10.0, FiO2 of 28.0%.   GENERAL: A 59 year old critically ill-appearing white man on the ventilator.   HEENT: Normocephalic, atraumatic. Pupils appeared to be equal and round, unable to assess extraocular motion. He had some scleral injection bilaterally, but there were no obvious emboli or petechiae on the sclerae, conjunctivae or lids. Oropharynx: Unable to evaluate due to his current clinical condition.   NECK: Midline trachea. Tracheostomy in place. No surrounding erythema around the tracheostomy. There is no lymphadenopathy appreciated.   CHEST: Clear to auscultation bilaterally with good air movement. No focal consolidation.   HEART: Regular rate and rhythm without murmur, rub, or gallop.   ABDOMEN: Soft and nondistended. No hepatosplenomegaly. No hernia is noted.   EXTREMITIES: No evidence for tenosynovitis.   SKIN: No rashes. No stigmata of endocarditis, specifically no Janeway lesions nor Osler nodes.   NEUROLOGIC: The patient is unresponsive and unable to provide any history or follow any commands.   PSYCHIATRIC: Unable to assess.   LABORATORY, DIAGNOSTIC AND RADIOLOGICAL DATA:  BUN 15, creatinine 0.61, bicarbonate 33, anion gap 6.  LFTs on May 2nd showed an AST of 91, ALT 55, alkaline phosphatase 90, total bilirubin 0.7. TSH from admission was 1.93. Tox screen from admission was negative.  White count today  was 17.4 with a hemoglobin 9.7, platelet count 330, ANC of 15.3. White count on admission was 10.2 and remained until May 14th when he went up to 14.9. He has remained in the teens since that  time. A nasal culture was obtained on May 3rd which grew group B strep, coagulase-negative staph, aerobic gram-positive rods and Prevotella species.  Blood cultures from May 3rd show no growth. Blood cultures from May 5th show no growth.  C. difficile PCR from May 14th was negative.  A sputum culture from May 17th grew Candida species.  Blood cultures from May 19th are pending.  A urinalysis from admission was unremarkable. A urinalysis from May 19th was again unremarkable.  A lipase obtained on May 6th was 479. No further testing has been done.  Chest x-ray from admission shows lung fields are clear.  A CT scan of the head without contrast from admission showed no acute intracranial process. Subsequent chest x-rays have showed increased density in the middle and lower lung fields consistent with atelectasis versus developing pneumonia.  A CT scan of the head on May 5th showed worsening pan sinus inflammation.  CT scan of the abdomen and pelvis on May 10th without contrast showed fluid-filled loops of colon. No obstruction or perforation was present. There was atelectasis in the lung fields. KUB from May 18th showed a nonobstructive bowel gas pattern.  A CT scan of the head without contrast from May 19th showed no acute intracranial process. There were slight interval decrease from the paranasal sinus disease.  Chest x-ray from May 19th showed continued atelectasis.   IMPRESSION: A 59 year old white man with a history of possible alcohol seizures, who was admitted with alcohol intoxication and mental status changes, who was intubated for airway control and now has fever and leukocytosis.   RECOMMENDATIONS:  1. He was initially thought to be confused due to alcohol intoxication. He had a traumatic intubation and has remained agitated when trying to lighten his sedation. He is unable to tolerate tube feeds and is getting TPN. His lipase was moderately elevated at one point in his hospitalization.  Cultures have not been revealing.  2. Possible etiologies for his fever and leukocytosis include: Fungemia (he is on TPN which is a risk factor, but his blood cultures have been negative), pneumonia (he is breathing spontaneously without significant high oxygen need and has no sputum production), urinary tract infection (but his urinalysis is completely unremarkable), pancreatitis, deep vein thrombosis/PE, DTs (although he should have gone through withdrawal by now),  sinusitis (although with his tracheostomy in place there is less likely to be occlusion of the sinus os).  3. Repeat blood cultures are pending.  4. We will send a lipase in the morning.  5. We will get venous Dopplers.  6. If the above work-up is negative, would consider a CT of the chest to look for pulmonary embolism. This will also give us a better look the lung parenchyma. We will need to consider doing the abdomen and pelvis at the same time to look for abscesses given his prior CTs have been without contrast.  7. He has been on vancomycin for 8 days. We will discontinue this.  Levofloxacin is not likely to be broad enough if he does have a hospital-acquired infection.  8. We will start meropenem and follow the fever curve and white count.   This is a highly complex Infectious Disease case.   Thank you very much for involving me in Mr. Reva's care.  ____________________________ Rosalyn Gess Prospero Mahnke, MD meb:cbb D: 10/22/2011 16:00:35 ET T: 10/22/2011 16:35:14 ET JOB#: [id number]  cc: Rosalyn Gess. Maxwel Meadowcroft, MD, <Dictator> Gisela Lea E Stclair Szymborski MD ELECTRONICALLY SIGNED 10/24/2011 12:43

## 2014-09-26 NOTE — Consult Note (Signed)
Chief Complaint:   Subjective/Chief Complaint Dobhoff was placed yesterday. Patient continues to be febrile. ID note reviewed. Will start Dobhoff feedings at low rate to see if he tolerates the feedings. Patient will eventually need PEG, ideally once he is afebrile and tolerating feedings at least to some extent. Will follow.   Electronic Signatures: Lurline DelIftikhar, Hilary Pundt (MD)  (Signed 20-May-13 17:37)  Authored: Chief Complaint   Last Updated: 20-May-13 17:37 by Lurline DelIftikhar, Gamble Enderle (MD)

## 2014-09-26 NOTE — Consult Note (Signed)
PATIENT NAME:  Johnny Johnny [name], Johnny Johnny [name] MR#:  [id number] DATE OF BIRTH:  1955-12-31  DATE OF CONSULTATION:  10/23/2011  REFERRING PHYSICIAN:   CONSULTING PHYSICIAN:  Annice NeedyJason S. Said Rueb, MD  REASON FOR CONSULTATION: Fever, suspected line infection.   HISTORY OF PRESENT ILLNESS: This is Johnny [name] 59 year old white male with multiple ongoing issues including ventilator dependence and persistent fevers. He has been admitted to the hospital for nearly three weeks and has Johnny [name] central line, which is 5317 days old. His fever was as high as 104. He has Johnny [name] very extensive fever work-up ongoing but the line is certainly Johnny [name] potential source for infection we are asked to evaluate for this and to place Johnny [name] new access. His records are obtained from the previous medical record as he is intubated and sedated and no family or friends are currently at bedside.   PAST MEDICAL HISTORY:  1. Hypertension.  2. Dyslipidemia.  3. Seizures.   PAST SURGICAL HISTORY: Unknown, except for Johnny [name] trach on this admission.   SOCIAL HISTORY: Apparently heavy alcohol use.   FAMILY HISTORY: Unknown.   REVIEW OF SYSTEMS: Unobtainable.   HOME MEDICATIONS: Per the History and Physical: 1. Norvasc/benazepril 10/20 daily.  2. Lipitor 20 mg daily. 3. Coreg 12.5 mg b.i.d.  4. Citalopram 20 mg daily.  ALLERGY: Keppra.    PHYSICAL EXAMINATION:  GENERAL: This is Johnny [name] critically ill white male who appears older than his stated age in the Critical Care Unit.   VITAL SIGNS: Temperature 100.6 currently, as high as 104 yesterday. Pulse 100, blood pressure 136/69.   HEENT: Tracheostomy and orogastric tube in place. Eyes: Sclerae anicteric. Conjunctivae are clear. Ears: Normal external appearance. Unable to assess hearing.   HEART: Tachycardic but regular.   LUNGS: Coarse with rhonchi bilaterally.   ABDOMEN: Soft, nondistended. Unable to assess tenderness.   EXTREMITIES:  Moderate lower extremity edema. Feet are warm with good capillary refill.   NEUROLOGIC: He is  sedated and nonresponsive at this time. This is medically induced.   LABORATORY, DIAGNOSTIC, AND RADIOLOGICAL DATA: Sodium 139, potassium 3.2, chloride 100, CO2 32, BUN 17, creatinine 0.96, glucose 132, white blood cell count 11.9, hemoglobin 9.1, platelet count 267,000.   ASSESSMENT AND PLAN: This is Johnny [name] 59 year old white male with high fever that could certainly be from Johnny [name] line infection. We will place Johnny [name] new central line and the catheter tip on the other side can be sent for culture.     This is Johnny [name] level-3 consultation.  ____________________________ Annice NeedyJason S. Maile Linford, MD jsd:bjt D: 10/23/2011 18:00:24 ET T: 10/24/2011 09:07:47 ET JOB#: [id number]  cc: Annice NeedyJason S. Azile Minardi, MD, <Dictator> Annice NeedyJASON S Rayson Rando MD ELECTRONICALLY SIGNED 11/08/2011 13:14

## 2014-09-26 NOTE — Consult Note (Signed)
Chief Complaint:   Subjective/Chief Complaint Patient is down for a CT scan. Will evaluate in am.   Electronic Signatures: Lurline DelIftikhar, Kash Davie (MD)  (Signed 21-May-13 18:05)  Authored: Chief Complaint   Last Updated: 21-May-13 18:05 by Lurline DelIftikhar, Hazael Olveda (MD)

## 2014-09-26 NOTE — H&P (Signed)
PATIENT NAME:  Johnny [name], Johnny [name] MR#:  [id number] DATE OF BIRTH:  1956-01-15  DATE OF ADMISSION:  10/04/2011  REASON FOR HOSPITAL VISIT: Altered mental status, patient brought in by a friend.   NOTE: All history has been obtained by chart review and the patient's nurse. Emergency Room physician, Dr. Enedina Finner, who had seen the patient is not available. He has already left, and the patient's friend has also left. The patient is unable to provide any history as he is intubated.  HISTORY OF PRESENT ILLNESS: This is a 59 year old Caucasian gentleman who was discharged from [facility] yesterday where he was admitted for alcohol abuse and hypertension. The patient was then sent home, and this evening he was found by a friend in an agitated state. He was found agitated by the friend, and he was brought to the ER where he remained extremely agitated; and for airway protection he was intubated by the ER physician. According to the nurse, the intubation was traumatic, and the patient after intubation had some blood in his oral cavity. The patient was subsequently placed on ventilator, Versed drip, and IV Ativan p.r.n. Chest x-ray obtained was unremarkable, so was his head CT (according to the ER physician  sign out report. No official report is available.)  We were called for the above dictated reason.   PAST MEDICAL HISTORY: (According to chart review)  1. Hypertension. 2. Dyslipidemia.  3.? H/O Seizures(was on Keppra... was it Alcohol induced seizure as not on any anti seizure Meds now)  PAST SURGICAL HISTORY: Unavailable.   PERSONAL HISTORY: Unavailable.   SOCIAL HISTORY: Unavailable, but the patient likely drinks alcohol on a regular basis.   FAMILY HISTORY: Unavailable.   REVIEW OF SYSTEMS: Review of systems is unavailable.   HOME MEDICATIONS: The patient was discharged from [facility] on Norvasc/Benazepril 10/20 mg p.o. daily. His old records indicate that the patient had been taking Lipitor 20 mg  daily, along with Coreg 12.5 mg p.o. b.i.d., and citalopram 20 mg p.o. daily.   ALLERGIES: His allergies listed in the chart are to Keppra.   PHYSICAL EXAMINATION:  VITAL SIGNS: Temperature 97.6, pulse 92, respirations 18, blood pressure 99/60. He is 100% on 30% FiO2. His vent settings are mode AC, set rate 18, tidal volume 550, FiO2 30%. He is not breathing over the vent.   GENERAL: A middle-aged Caucasian male lying in a hospital bed, sedated and intubated with  an ET tube and an OG tube, with some old blood in his mouth which is now dry. No current signs of active bleeding.   HEENT: Normocephalic, atraumatic head. Pupils are equal in size, sluggishly reactive to light, bilaterally symmetrical.   PSYCHIATRIC: Exam is unobtainable.   NEUROLOGICAL: Central nervous system: The patient is sedated. He does withdraw to painful stimuli in all four extremities. Plantars are going down.   NECK: Neck is supple. No JVD.   PULMONARY: Symmetrical chest wall movement bilaterally with good air movement bilaterally. No rhonchi, mild bibasilar rales.   CARDIOVASCULAR: Regular rhythm. Normal S1, S2. No gallops, rubs or murmurs.   ABDOMEN: Obese, soft. Positive bowel sounds. No apparent discomfort on deep palpation.  EXTREMITIES: No cyanosis, clubbing, or edema. No apparent joint effusion. The patient is moving all four extremities to painful stimuli.   LABORATORY, DIAGNOSTIC AND RADIOLOGICAL DATA:  White count 10.2, hemoglobin 14, hematocrit 41.9, platelets 155. Sodium 134, potassium 3.4, chloride 96, bicarbonate 25, BUN 15, creatinine 0.7, magnesium 2.  Serum ethanol level percentage is 0.297.  Top normal is 0.080.  ABGs:  pH 7.24, pCO2 47, pO2 193, bicarbonate 20.1.  Chest x-ray, personal review:  ET tube in stable position. No acute cardiopulmonary process.  CT head as per ER physician sign out: No acute abnormalities.  Urine drug screen is negative.  TSH is 1.9. Troponin is less than 0.02. CK  total 812, CK-MB 7.3.  EKG: Sinus tachycardia, rate 115 beats per minute. QTc 464 ms. No acute ST-T wave changes.  ASSESSMENT AND PLAN: Altered mental status secondary to alcohol intoxication requiring endotracheal intubation for airway protection, complicated by traumatic intubation and oral airway trauma with mild bleed, which has resolved.   PLAN:  1. Admit the patient to Intensive Care Unit. We will continue him on Versed drip, plus IV Ativan p.r.n. He will get gentle IV fluids. Vent settings have been written. ABGs, chest x-ray will be repeated in the morning. Pulmonary consult will be obtained. The patient likely will be extubated in the morning if his mental status remains stable. ?? H/O Seizures - will check EEG 2. Alcohol intoxication: Currently Versed and Ativan p.r.n. The patient will be started on thiamine and folate supplement, currently IV. Once stable, p.o. supplementation and counseling can be provided.  3. History of hypertension: For now unable to take p.o. medications. IV Lopressor every 6 hours p.r.n. with written parameters will be given.  4. Combined metabolic and respiratory acidosis: Secondary to alcohol intoxication and decreased mental status. Vent support as above. Repeat ABG in the morning along with complete metabolic panel. Urine drug screen is negative.  5.Low K - replace IV, repeat BMP in am. 5. SCDs for deep vein thrombosis prophylaxis:  6. Protonix IV for peptic ulcer disease prophylaxis.   CODE STATUS:   The patient will be FULL CODE.   C Care time 45 mins.  ____________________________ Stanford ScotlandPrashant K. Thedore MinsSingh, MD pks:cbb D: 10/04/2011 00:34:14 ET T: 10/04/2011 09:40:57 ET JOB#: [id number]  cc: Bess HarvestPrashant K. Thedore MinsSingh, MD, <Dictator> Stanford ScotlandPRASHANT K [facility] Suite PcINGH MD ELECTRONICALLY SIGNED 10/04/2011 10:30

## 2014-09-26 NOTE — Consult Note (Signed)
Chief Complaint:   Subjective/Chief Complaint PEG placed. NPO. May use PEG for medicines only after 4 hours. Do not use PEG for feedings for 24 hours than start feedings per protocol.   Electronic Signatures: Lurline DelIftikhar, Markiah Janeway (MD)  (Signed [phone]-May-13 17:45)  Authored: Chief Complaint   Last Updated: 30-May-13 17:45 by Lurline DelIftikhar, Coreen Shippee (MD)

## 2014-09-26 NOTE — Consult Note (Signed)
Chief Complaint:   Subjective/Chief Complaint PEG tube site looks OK. Bowel sounds very sluggish.  May start feedings later today.  Will sign off. Please reconsult GI if needed.   Electronic Signatures: Lurline DelIftikhar, Marena Witts (MD)  (Signed [phone]-May-13 11:26)  Authored: Chief Complaint   Last Updated: 31-May-13 11:26 by Lurline DelIftikhar, Destony Prevost (MD)
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
## 2014-09-26 NOTE — Consult Note (Signed)
PATIENT NAME:  Johnny Johnny [name], Johnny Johnny [name] MR#:  [id number] DATE OF BIRTH:  February 07, 1956  DATE OF CONSULTATION:  10/15/2011  REFERRING PHYSICIAN:  Ned ClinesHerbon Fleming, MD   CONSULTING PHYSICIAN:  Cammy CopaPaul H. Peni Rupard, MD  HISTORY OF PRESENT ILLNESS: The patient is Johnny [name] 59 year old white male who presented 10 days ago to the Emergency Room with altered mental status. He apparently had been drinking alcohol and was found by friends unable to respond appropriately. In the Emergency Room he was intubated for controlling his airway and protecting against aspiration. His alcohol level was markedly elevated. The patient has been in the Intensive Care Unit and has failed extubation two different times. The first time he self extubated and could not handle it, and then he was weaned some and tried extubation this morning but failed again, had to be reintubated. Consultation is placed for evaluation for Johnny [name] trach tube for rehabilitation.   PAST MEDICAL HISTORY: Significant for: 1. Hypertension. 2. Dyslipidemia. 3. Possible history of seizures, possibly alcohol-induced.   PAST SURGICAL HISTORY: Unavailable.   SOCIAL HISTORY: He has been known to be Johnny [name] regular alcohol drinker.   REVIEW OF SYSTEMS: Review of systems is unavailable since the patient is sedated.   ALLERGIES: The patient is allergic to Keppra.    PHYSICAL EXAMINATION:  GENERAL: The patient is not awake at this time. He is fully sedated. He has an oral tube in place.   NECK: His neck is relatively thin. He does not have any nodes or masses. His trachea and larynx are in the midline. There is no swelling or bruising here. He has Johnny [name] central line in the left side.   OROPHARYNX: His mouth has the tube in it, and I am able to see the back of his throat well.   IMPRESSION/PLAN: The patient has had altered mental status and has failed extubation twice. He is Johnny [name] good candidate for Johnny [name] tracheostomy for controlling his airway better and allowing him to get off his sedation and begin  the rehabilitation process. The tube has been in his larynx now 10 days total, and he needs to have the trach to help prevent damage from occurring at his voice box level. I have spoken with Dr. Meredeth IdeFleming in regards to this, and we will attempt to place him on the schedule for tracheostomy on Wednesday or Thursday of this week when time is available.   ____________________________ Cammy CopaPaul H. Kohler Pellerito, MD phj:cbb D: 10/15/2011 18:38:41 ET T: 10/16/2011 10:29:09 ET JOB#: [id number]  cc: Cammy CopaPaul H. Suzette Flagler, MD, <Dictator> Cammy CopaPAUL H Idolina Mantell MD ELECTRONICALLY SIGNED 10/18/2011 12:01

## 2014-09-26 NOTE — Consult Note (Signed)
Chief Complaint:   Subjective/Chief Complaint Patient still with high fever and bacteremia. Will hold off on PEG. Agree with TPN. Hold tube feedings although may use it for medicines. Will re-evaluate next week if still here or otherwise PEG can be done at the log term facility.   Electronic Signatures: Lurline DelIftikhar, Orval Dortch (MD)  (Signed [phone] [id number]-May-13 17:33)  Authored: Chief Complaint   Last Updated: 23-May-13 17:33 by Lurline DelIftikhar, Herndon Grill (MD)

## 2014-09-26 NOTE — Consult Note (Signed)
Patient with high fevers, multiple issues include vent dependence.  Has old central line.  Asked to place new access.  Right IJ placed without difficulty.  Left IJ tip can be sent for culture.    Electronic Signatures: Annice Needyew, Jason S (MD)  (Signed on 21-May-13 17:53)  Authored  Last Updated: 21-May-13 17:53 by Annice Needyew, Jason S (MD)

## 2014-09-26 NOTE — Op Note (Signed)
PATIENT NAME:  Johnny [name], Johnny [name] MR#:  [id number] DATE OF BIRTH:  1956/03/18  DATE OF PROCEDURE:  10/23/2011  PREOPERATIVE DIAGNOSES:  1. Fever with suspected line infection.  2. Ventilator dependence.   POSTOPERATIVE DIAGNOSES:  1. Fever with suspected line infection. 2. Ventilator dependence.  PROCEDURE PERFORMED:   1. Ultrasound guidance for vascular access, right jugular vein.  2. Placement of right jugular triple lumen catheter.   SURGEON: Annice NeedyJason S. Dew, MD   ANESTHESIA: Local.   ESTIMATED BLOOD LOSS: Minimal.   INDICATION FOR PROCEDURE: The patient is [name] 59 year old male who has been in the Intensive Care Unit on the ventilator for several weeks. He has had [name] line in place for well over two weeks. This is [name] potential source for his high-grade fever up to 104.0. We are asked to place [name] new line, and his old line will be removed and the tip sent for culture.   DESCRIPTION OF PROCEDURE: The patient was laid flat in the critical care bed. The right neck was sterilely prepped and draped, and [name] sterile surgical field was created. The jugular vein was visualized with ultrasound and found to be widely patent. It was then accessed under direct ultrasound guidance without difficulty with [name] Seldinger needle. [name] J-wire was placed after skin nick and dilatation. [name] triple lumen catheter was placed over the wire, and the wire was removed. It was secured at 19 cm with 3 silk sutures.  ____________________________ Annice NeedyJason S. Dew, MD jsd:cbb D: 10/23/2011 17:56:28 ET T: 10/23/2011 18:03:22 ET JOB#: [id number] Annice NeedyJASON S DEW MD ELECTRONICALLY SIGNED 10/31/2011 14:40

## 2014-09-26 NOTE — Consult Note (Signed)
Chief Complaint:   Subjective/Chief Complaint Patient re-evaluated. If patient leaves for long term acute care facility tomorrow, PEG can be done there. If he stays through Wednesday will do PEG here on Wednesday. Will recheck plans tomorrow. Thanks.   Electronic Signatures: Lurline DelIftikhar, Esli Clements (MD)  (Signed [id number]-May-13 17:25)  Authored: Chief Complaint   Last Updated: 27-May-13 17:25 by Lurline DelIftikhar, Latrail Pounders (MD)

## 2014-09-26 NOTE — Consult Note (Signed)
Impression: 59yo WM w/ h/o possible EtOH seizures admitted with alcohol intoxication and MS changes who was intubated for airway control and who now has fever and leukocytosis.  He was initially thought to be confused due to alcohol intoxication.  He had a traumatic intubation and has remained agitated with trying to lighten his sedation.  He is unable to tolerate tube feeds and is getting TPN.  His lipase was moderately elevated at one point in his hospitalization.  Cultures have not been very revealing. Possible etiologies for his fever and leukocytosis include: fungemia (he is on TPN which is a risk factor, but his BCx have been negative), pneumonia (he is breathing spontaneously without significantly high oxygen need and has no sputum production), UTI (but his u/a is unremarkable), pancreatitis, DVT/PE, DTs (although he should have gone through withdrawal by this point), sinusitis (although with his tracheostomy in place there is less likely to be occlusion of the sinus osia). Repeat BCx are pending.  Will send lipase in am. Will get venous dopplers. If above workup is negative, will consider CT chest to look for PE.  This will also give a better look at the lung parenchyma.  Will need to consider abd/pelvis at the same time to look for abscess. He has been on vanco for 8 days.  Will d/c this.  Levofloxacin is not likely to be broad enough if he does have a hospital acquired infection.   Will start meropenem and follow the fever and WBC curve.   Electronic Signatures: Rashada Klontz, Rosalyn GessMichael E (MD) (Signed on 20-May-13 15:43)  Authored   Last Updated: 20-May-13 16:00 by Nataliee Shurtz, Rosalyn GessMichael E (MD)

## 2014-09-26 NOTE — Discharge Summary (Signed)
PATIENT NAME:  Johnny [name], Johnny [name] MR#:  [id number] DATE OF BIRTH:  06-29-1955  DATE OF ADMISSION:  10/04/2011 DATE OF DISCHARGE:  11/01/2011   ADMITTING PHYSICIAN: Susa Raring, MD   DISCHARGING PHYSICIAN: Enid Baas, MD    PRIMARY MD: None.  CONSULTATIONS IN THE HOSPITAL:  1. Pulmonary critical care consultation by Dr. Belia Heman   2. Neurology consultation by Dr. Suzan Slick  3. ENT consultation by Dr. Elenore Rota for tracheostomy   4. GI consultation by Dr. Niel Hummer  5. Infectious Disease consultation by Dr. Casimiro Needle Blocker  6. Vascular consultation by Dr. Wyn Quaker for triple lumen catheter placement.   For further details, please also refer to the history and physical dictated by Dr. Susa Raring, interim summary dictated by Dr. Alford Highland on 10/12/2011, interim summary dictated by Dr. Auburn Bilberry on 10/19/2011, and another interim summary dictated by Dr. Luberta Mutter on 10/29/2011.   FINAL DIAGNOSES:  1. Acute respiratory failure ventilator dependent, intubated initially for airway protection.  2. Metabolic encephalopathy secondary to alcohol withdrawal.  3. Central line infection with coagulase-negative Staph resulting in fevers.  4. Delirium tremens.  5. Alcohol abuse.  6. Hypertension.  7. History of seizure disorder.  8. Anxiety.  9. Anemia of chronic disease.  10. Intolerance to tube feeds likely from ileus, currently on TPN.    DISCHARGE MEDICATIONS:  1. Combivent inhaler 8 puffs q.4 hours.  2. Flovent 220 mcg 2 puffs q.12 hours.  3. Reglan 10 mg IV q.6 hours.  4. Lasix 20 mg p.o. b.i.d.  5. Nystatin to groin area apply q.12 hours.  6. Metoprolol 12.5 mg p.o. b.i.d.  7. Scopolamine patch one patch q.72 hours.  8. Versed 4 mg IV push q.1 hour p.r.n. for anxiety.  9. Ranitidine 50 mg IV q.8 hours.  10. Milk of Magnesia 30 mL p.o. at bedtime p.r.n. for constipation.  11. Sliding scale insulin. Cover with regular insulin 2 units for fingerstick blood sugar of 151 to 200,  4 units for 201 to 250, 6 units for 251 to 300, 8 units for 301 to 350, and 10 units for 351 to 400. If sugars greater than 400, call MD.   DISCHARGE DIET: Currently on TPN-Clinimix E 5/20 with electrolytes, multivitamin concentration 12 injection 10 mL, trace elements injection 1 mL, magnesium sulfate injection 500 mg one at 95 mL per hour and run by central line.   DISCHARGE ACTIVITY: As tolerated.   FOLLOW-UP INSTRUCTIONS: The patient will be transferred to [facility] whenever bed is available and PCP follow-up there, Pulmonary consult there for vent management and wean, and GI consult for PEG placement.   LABS AND IMAGING STUDIES: Most recent labs include WBC 10.5, hemoglobin 9.6, hematocrit 29.6, platelet count 387. Magnesium 2.1. Sodium 138, potassium 4.0, chloride 99, bicarb 33, BUN 21, creatinine 0.69, glucose 125, calcium 9.4.   Most recent chest x-ray on 10/29/2011 showing no acute changes and right IJ catheter is present with tip projected over superior vena cava. No pneumothorax or pleural effusion seen.  His last blood cultures were from 10/23/2011 growing Staphylococcus epidermidis and coagulase-negative Staph from 10/21/2011. Sputum cultures were just growing heavy growth of Candida albicans from 10/19/2011.   BRIEF HOSPITAL COURSE: [name] is a 59 year old male with no significant past medical history other than history of hypertension and seizure disorder, questionable if it was an alcoholic induced seizure, who was brought in after he was found unresponsive and in an agitated state by his friend. He was [facility] to [facility] for alcohol abuse and withdrawal symptoms prior to this hospitalization. He was very lethargic and because of his inability to protect airway he was intubated and successfully placed on ventilator.  1. Acute respiratory failure for airway protection and ventilator dependent. He has been on vent. It was very hard trying to get him weaned off probably  because of his alcohol abuse. He was requiring higher doses of Fentanyl, Versed, and Precedex drips along with getting IV p.r.n. Ativan and Versed for agitation. Likely cause was metabolic encephalopathy. He did have an EEG done by Dr. Sherryll BurgerShah on admission, 10/05/2011, which showed beta activity which was medication effect as the patient has been on Versed drip and generalized slowing which is nonspecific exam. He was seen by Dr. Suzan SlickPeter Clarke from Neurology in consultation who felt like the underlying cause of his altered mental status was alcohol withdrawal and initially was placed on antiepileptic medication but gradually taken off of them. The patient is currently weaned off all the sedation and using Versed IV p.r.n. for anxiety and agitation. He did have a CT of the head x3 during the hospital course which showed only chronic changes but no acute changes otherwise. He had a tracheostomy done by Dr. Elenore RotaJuengel on 10/18/2011 and has been on minimal vent settings since then. Plan to wean as tolerated. Pulmonary has been following the patient while in the hospital. 2. Fevers. The patient has had fevers a couple of weeks ago, was on antibiotics for ventilator dependent pneumonia, sinus infection earlier during the hospital course in spite of which he had fevers. His cultures were growing Staph epidermidis. He was seen by Infectious Disease specialist, Dr. Orson AloeMichael Blocker, who felt changing the central line would be ideal so his central line, which has been in place for more than two weeks since admission, was changed and fevers resolved and he was treated with meropenem for 7 days after change of line. Currently his white count is normalized and he does not have any further fever so far. The patient is more alert and following commands at this time with periods of anxiety in between. 3. Possible ileus with inability to tolerate GI tube feeds. He now has a Dobbhoff tube in place with oral medications being crushed and  given with better absorption. He has been on TPN because of his ileus in the past. He is on Reglan 10 mg scheduled dose IV q.6 hours. He will need a PEG tube and slow initiation of tube feeds. Earlier PEG tube was not done because of the ileus. There was a question if he might need a surgical procedure for a jejunostomy tube but it seems like this is resolving at this point and a PEG tube can be attempted in the near future. He will [facility] get [facility].  4. Possible fluid overload being in bed. He was getting peripheral edema and was started on low dose Lasix 20 b.i.d. and he seems to be tolerating that well. His echo shows normal ejection fraction. No congestive heart failure. Once his mobility improves, he can probably be weaned off the Lasix.   CODE STATUS: FULL CODE.   The contact family member is a brother who has been updated about the patient's transfer. His course has been otherwise uneventful in the hospital.   DISCHARGE CONDITION: Stable.   DISCHARGE DISPOSITION: To long-term acute care facility, likely [facility].   TIME SPENT ON DISCHARGE: 45 minutes.   ____________________________ Enid Baasadhika Doron Shake, MD rk:drc D: 11/01/2011  09:43:28 ET T: 11/01/2011 10:21:24 ET JOB#: [id number]  cc: Enid Baas, MD, <Dictator> Enid Baas MD ELECTRONICALLY SIGNED 11/05/2011 10:14

## 2014-09-26 NOTE — Op Note (Signed)
PATIENT NAME:  Johnny [name], [name] MR#:  [id number] DATE OF BIRTH:  04-14-1956  DATE OF PROCEDURE:  10/18/2011  PREOPERATIVE DIAGNOSES:  1. Prolonged intubation. 2. Failure to wean.   POSTOPERATIVE DIAGNOSES:  1. Prolonged intubation. 2. Failure to wean.   PROCEDURE: Tracheostomy.   SURGEON: Cammy CopaPaul H. Fayth Trefry, MD  ANESTHESIA: General.   COMPLICATIONS: None.   TOTAL ESTIMATED BLOOD LOSS: Minimal.   DESCRIPTION OF PROCEDURE: Patient was brought to the Operating Room from the Intensive Care Unit. He was left on the Intensive Care Unit bed. Shoulder roll was placed and his neck was extended a little bit. The area was prepped with alcohol and some of his beard and anterior chest hair was shaved. He was then marked with the marking pen and 5.5 mL of 1% Xylocaine with epinephrine 1:200,000 was used for infiltration of the anterior neck. The patient was then prepped and draped in sterile fashion.   An incision was made through the skin and subcutaneous tissue. Some excessive fat was removed overlying the strap muscles. The strap muscles were divided in the midline down to the level of just below the cricoid ring. There is a very thick thyroid isthmus in this area. Thyroid isthmus was divided in the midline using the Harmonic to get into the pretracheal fascia. With the trachea clearly evident oxygen levels were brought down to 40% inspiratory oxygen levels. A trach hook was placed for elevating the cricoid ring and stabilizing the trachea. An incision was made between the second and third tracheal rings. This was horizontal. There was a lot of purulence that came out as soon as I made the incision. Large curved Mayo scissors were used for making lateral incisions inferiorly and creating an upside down U-shaped flap. This was sutured to the sub-Q inferiorly with a 4-0 Vicryl suture. The trachea was clearly seen. The cuff on the balloon was accidentally cut. The trach tube was pulled back. There was purulence  around the trach tube. Once it got just above the tracheal opening I suctioned a little bit o the trachea. There is some necrotic tissue in the posterior tracheal wall just above where the cuff was sitting. The trach tube was then placed into the trachea. The cuff was down several centimeters from where the posterior mucosa was very irritated. The cuff was inflated. Oxygenation is good. His lungs were suctioned clear. There is no bleeding here. The trach hook was released. The self-retaining retractors were released as well. 2-0 nylon was used for suturing the trach tube to the skin and then sponge trach ties were attached to the trachea and secured around the neck. Trach dressing was applied. The patient tolerated the procedure well. He was awakened, taken to the recovery room in satisfactory condition. There were no operative complications. Total estimated blood loss was very minimal.   ____________________________ Cammy CopaPaul H. Tranell Wojtkiewicz, MD phj:cms D: 10/18/2011 12:04:41 ET T: 10/18/2011 12:28:22 ET JOB#: [id number]  cc: Cammy CopaPaul H. Ahron Hulbert, MD, <Dictator> Cammy CopaPAUL H Kayona Foor MD ELECTRONICALLY SIGNED 11/12/2011 8:36

## 2014-09-26 NOTE — Consult Note (Signed)
Chief Complaint:   Subjective/Chief Complaint Overall same. Blood culture positive for GPC as well as catheter tip. Fever better. Positive leucocytosis. Seems to be tolerating Dobhoff feedings at a low rate with benign abdomen.  Recommendations: Continue tube feeds and increase slowly. Will hold off on PEG due to positive blood cultures. This can be done at the long term facility after his transfer and resolution of bactremia. Discussed with Dr. Luberta MutterKonidena.   Electronic Signatures: Lurline DelIftikhar, Jillene Wehrenberg (MD)  (Signed [phone]-May-13 17:30)  Authored: Chief Complaint   Last Updated: 22-May-13 17:30 by Lurline DelIftikhar, Tyrone Balash (MD)

## 2014-09-26 NOTE — Consult Note (Signed)
PATIENT NAME:  Johnny [name], Johnny [name] MR#:  [id number] DATE OF BIRTH:  [birth year]  DATE OF CONSULTATION:  10/19/2011  REFERRING PHYSICIAN:   CONSULTING PHYSICIAN:  Lurline Del, MD  REASON FOR CONSULTATION: Poor tolerance of tube feeding and need for more permanent feeding access.   HISTORY OF PRESENT ILLNESS: 59 year old male who was admitted about 10 days ago through the Emergency Room when he presented with acute encephalopathy thought to be secondary to alcohol. The patient has been on the ventilator since then and multiple attempts to wean him have failed, mostly because of his agitation. The patient underwent a tracheostomy yesterday. The patient has been fed through an OG-tube after admission, although his residuals  remain very high and the tube feedings have been interrupted on numerous occasions. The patient's OG tube has been removed and currently there is no antral access for feeding. The patient is being maintained on TPN. The patient was evaluated. The case was discussed with the nurses taking care of the patient. No family members are available at this time. The patient is sedated. He is on ventilator. He appears to be comfortable.   PAST MEDICAL HISTORY: As reported in the chart, significant for hyperlipidemia and hypertension as well as history of possible seizures, most likely related to alcohol withdrawals.   PAST SURGICAL HISTORY: Not available.   SOCIAL HISTORY: Not available.   REVIEW OF SYSTEMS: Not available.    ALLERGIES: According to the chart, he is allergic to Keppra.   PHYSICAL EXAMINATION:  GENERAL: Fairly well built male. He is currently sedated on the ventilator. Clinically, no signs of distress.    VITAL SIGNS: His most recent vitals, temperature was reported to be 101.4, pulse is around 98, blood pressure 115/65.   SKIN: Skin is fairly unremarkable. No signs of clinical jaundice or anemia.   LUNGS: Grossly clear to auscultation bilaterally.   CARDIOVASCULAR:  Regular rate and rhythm.   ABDOMEN: Examination showed very sluggish bowel sounds and they are very infrequent. Abdomen is otherwise nondistended. Tenderness is hard to assess because of the patient being sedated. No hepatosplenomegaly or ascites was noted.   NEUROLOGIC: Examination cannot be assessed because of sedation as well.   LABORATORY, RADIOLOGICAL AND DIAGNOSTIC DATA: His most recent blood work-up showed a hemoglobin of 10.7, hematocrit 32.5, white cell count of 15.9 and a platelet count of 399. Chemistries: BUN and creatinine normal. The rest of the electrolytes are fairly unremarkable as well. No recent PT, PTT is available. CT scan of the abdomen done on 05/10 showed fluid-filled colon all the way down to anal canal. No other significant intra-abdominal pathologies were noted.   ASSESSMENT AND PLAN: The patient is with encephalopathy, most likely related to alcohol, also history of seizures. The patient was intubated on arrival, has been fairly agitated, recently underwent a tracheostomy. The patient has not been tolerating his diet very well, most likely secondary to ileus which is further evident by almost absent bowel sounds on physical examination today. I agree that an antral feeding access will be required and different options will be discussed with the physicians taking care of the patient as well as the family members including surgical jejunostomy tube versus a PEG tube versus a jejunostomy tube through the PEG tube, et Karie Soda. Further recommendations will be made after these discussions are held. Meanwhile, the patient is supported with TPN. We will follow and make further recommendations.   ____________________________ Lurline Del, MD si:ap D: 10/19/2011 18:49:32 ET T: 10/20/2011 10:35:19 ET JOB#:  [id number]  cc: Lurline DelShaukat Kearie Mennen, MD, <Dictator> Lurline DelSHAUKAT Janaya Broy MD ELECTRONICALLY SIGNED 10/24/2011 9:04

## 2014-09-26 NOTE — Discharge Summary (Signed)
PATIENT NAME:  Johnny [name], [place] A MR#:  [id number] DATE OF BIRTH:  [birth year]  DATE OF ADMISSION:  10/04/2011 DATE OF DISCHARGE:  11/13/2011  ADDENDUM TO DISCHARGE SUMMARY  PRIMARY CARE PHYSICIAN: None.   HOSPITAL COURSE: For a detailed discharge summary, please refer to the dictation by Dr. Nemiah CommanderKalisetti. Patient was [facility] further management by vent, however, patient got tracheostomy and PEG tube placement in the hospital. Patient was off the ventilator and on trach collar now. Since patient has a lot of secretions from trach sputum culture was done, which shows MRSA so patient has been treated with vancomycin for three days so far and since patient is going to be discharged to nursing home, vancomycin was changed to zyvox since yesterday. Patient is clinically stable, will be discharged to nursing home today.   DISCHARGE INSTRUCTIONS: Please refer to the previous discharge instructions by Dr. Nemiah CommanderKalisetti. The following is addition to the discharge instruction:  Procedure: tracheostomy and PEG placement.   ADDITIONAL MEDICATIONS:  1. Combivent inhalation 8 puffs q.4 hours. 2. Flovent 220 mcg 2 puffs q.12 hours. 3. Reglan 10 mg PEG q.6 hours. 4. Lasix 40 mg PEG b.i.d.   5. Nystatin cream to the groin area q.12 hours.  6. Lopressor 100 mg PEG b.i.d.  7. Scopolamine patch one patch q.72 hours. 8. Zyvox 600 mg PEG q.12 hours for 10 days. 9. Ranitidine 150 mg PEG q.12 hours. 10. Milk of magnesia 30 mL PEG p.r.n. for constipation.  11. Sliding scale.  12. Xanax q.8 hours.  13. Tylenol 650 mg PEG q.6 hours p.r.n. for fever and pain.   DIET: PEG tube feeding Vital 1.5 CAL RTH at 61 mL/h. Flush with water 25 mL/h.    ACTIVITY: As tolerated.   FOLLOW UP CARE:  1. Follow up primary care physician PCP within one week and also patient needs follow up pulmonary specialist for PMV trial and wean trach at nursing home 2. Physical therapy as tolerated.    ____________________________ Shaune PollackQing Sameul Tagle, MD qc:cms D: 11/13/2011 10:17:55 ET T: 11/13/2011 10:34:48 ET JOB#: [id number]  cc: Shaune PollackQing Ketrick Matney, MD, <Dictator> Shaune PollackQING Elysse Polidore MD ELECTRONICALLY SIGNED 11/13/2011 18:52

## 2014-09-29 NOTE — Consult Note (Signed)
PATIENT NAME:  Johnny [name], Waleed A MR#:  [id number] DATE OF BIRTH:  10/16/1955  DATE OF CONSULTATION:  06/01/2014  REFERRING PHYSICIAN:   CONSULTING PHYSICIAN:  Pauletta BrownsYuriy Tymika Grilli, MD  REASON FOR CONSULTATION:  Questionable right lower extremity weakness.   HISTORY OF PRESENT ILLNESS: A 59 year old gentleman with past medical history of EtOH abuse, questionable seizure disorder, found down with fecal incontinence, admitted with elevated white blood cell count, suspected to be septic. Mental status has improved throughout the hospital course. Neurology consulted for left-sided weakness.   REVIEW OF SYSTEMS: Unable to obtain.   PAST MEDICAL HISTORY: Reviewed.   LABORATORY DATA:  Electrolytes reviewed. CAT scan of the head done on admission showed no acute intracranial findings, but significant atrophy.   PHYSICAL EXAMINATION:  VITAL SIGNS: Reviewed, temperature is 98.1, pulse 90, respirations 20, blood pressure 140/92, pulse oximetry 99%.  NEUROLOGIC: The patient tells me his name, confused the date, confused where he is located. Speech appears to be dysarthric, possibly baseline. Extraocular movements intact. Pupils reactive bilaterally. Tongue is midline. Uvula elevates symmetrically. Shoulder shrug intact. Motor strength, generalized weakness, possible giveaway weakness in the left upper extremity 4 + out of 5 bilateral upper and lower extremities. Reflexes are diminished throughout. Coordination intact. Sensation appears to be intact.  Gait could not be assessed.   IMPRESSION: A 59 year old gentleman with history of chronic ethanol use, history of seizure disorder, neurology consulted for suspect new left-sided weakness. On current examination the patient has generalized weakness and likely some giveaway weakness on the left upper extremity. There is no facial droop noted.  There is dysarthria, but I am not convinced this is new.   PLAN: I agree with obtaining CAT scan of the head. I do not think we  need MRI at this point. The patient should be on antiplatelet therapy. We will follow up. Please call with any questions.    ____________________________ Pauletta BrownsYuriy Xzaria Teo, MD yz:bu D: 06/01/2014 13:10:19 ET T: 06/01/2014 14:02:25 ET JOB#: [id number]  cc: Pauletta BrownsYuriy Nainika Newlun, MD, <Dictator> Pauletta BrownsYURIY Selim Durden MD ELECTRONICALLY SIGNED 06/22/2014 21:11

## 2014-10-03 NOTE — Discharge Summary (Signed)
PATIENT NAME:  Johnny [name], Johnny [name] MR#:  [id number] DATE OF BIRTH:  03/10/1956  DATE OF ADMISSION:  05/30/2014 DATE OF DISCHARGE:  06/08/2014  ADMITTING DIAGNOSIS: Altered mental status.   DISCHARGE DIAGNOSES: 1. Acute encephalopathy felt to be due to multifactorial, including heavy alcohol abuse, fall, rhabdomyolysis, dehydration, and multiple electrolyte imbalances and now has baseline confusion.  2. Heavy alcohol abuse no evidence of delirium tremens.  3. Dysphagia. Swallow evaluation done on a modified diet.  4. Acute renal failure due to dehydration, now resolved with IV fluids.  5. Rhabdomyolysis resolved with IV fluids.  6. Electrolyte imbalances, replaced.  7. Possible sepsis, however, there was no evidence of infection noted. Blood cultures negative. Chest x-ray negative.  8. Hyponatremia due to dehydration, resolved.   CONSULTANTS: Neurology.  PERTINENT LABORATORIES AND EVALUATIONS: Admitting glucose 127, BUN 36, creatinine 1.37, sodium 149, potassium 3.4, chloride 116, CO2 is 23, calcium 9.0. LFTs were normal, except albumin of 3.1, bilirubin total 1.4, AST 105, ALT 59, CPK is 1146 CPK was 305 on December 30th. TSH 0.320, however, her free T3 was 1.08. Toxic urine drug screen was negative. Admitting WBC 18.9, hemoglobin 14.3. WBC on December 30th was normal. Blood cultures: No growth. Urinalysis: Nitrites negative, leukocytes negative. MRI of the brain showed no evidence of CVA,  right fascial soft tissue swelling related to injury.  HOSPITAL COURSE: Please refer to H and P done by the admitting physician. The patient is a 59 year old male with history of heavy alcohol abuse who last talked to his sister on 05/26/2014. Since the patient's family was trying to contact the patient but unable to get him. Finally, the police were called. They found the patient unresponsive at home, lying in his house with difficulty moving his right side and had pain on that side. The patient was seen in the ED,  had a CT of the head without contrast, which was negative. He had a fever over 99, and elevated WBC count of 18,000. Initially, there was thought to be some sort of infection; however, there was no source of infection. The patient was admitted for sepsis, altered mental status, dehydration, rhabdomyolysis and acute renal failure. The patient also was seen by neurology due to persistent confusion. The patient had a MRI which was negative for CVA.  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 The patient otherwise is doing better. His electrolytes are replaced, his rhabdomyolysis is resolved. He is needing rehab at this time. He will be discharged to a rehab facility.   DISCHARGE MEDICATIONS: Flovent 1 puff b.i.d.; metoprolol tartrate 100 mg 1 tab p.o. q.12; aspirin 325 p.o. daily; docusate 100 mg, 1 tablets p.o. b.i.d.; magnesium oxide 400 mg 1 tab p.o. q.8; acetaminophen/oxycodone 320-5 mg 1 tab p.o. q.6 p.r.n. for pain.   ACTIVITY: PT evaluation and treatment.   DIET: Low-sodium, low-fat, low-cholesterol, mechanical soft with thin liquids, ground meats with gravy added, strict aspiration precautions, assistance and tray setup with all meals. Medications in puree.   DISCHARGE FOLLOWUP: Follow up with skilled nursing facility in 1 to 2 weeks. Also some  Psychiatric follow-up at the skilled nursing facility for alcohol abuse.   TIME SPENT: 35 minutes on the discharge.   ____________________________ Lacie Scotts. Allena Katz, MD shp:dw D: 06/08/2014 11:16:02 ET T: 06/08/2014 11:50:45 ET JOB#: [id number]  cc: Sherby Moncayo H. Allena Katz, MD, <Dictator> Charise Carwin MD ELECTRONICALLY  SIGNED 06/12/2014 9:24

## 2015-10-14 ENCOUNTER — Inpatient Hospital Stay
Admit: 2015-10-14 | Disposition: A | Discharge status: Home Health | Source: Other Acute Inpatient Hospital | Attending: Pulmonary Disease | Admitting: Pulmonary Disease

## 2015-10-14 LAB — HX HEM-ROUTINE
HX BASO #: 0 10*3/uL (ref 0.0–0.2)
HX BASO: 0 %
HX EOSIN #: 0.3 10*3/uL (ref 0.0–0.5)
HX EOSIN: 3 %
HX HCT: 41.7 % (ref 37.0–47.0)
HX HGB: 13.9 g/dL (ref 13.5–16.0)
HX IMMATURE GRANULOCYTE#: 0.1 10*3/uL (ref 0.0–0.1)
HX IMMATURE GRANULOCYTE: 1 %
HX LYMPH #: 3.7 10*3/uL (ref 1.0–4.0)
HX LYMPH: 32 %
HX MCH: 30 pg (ref 26.0–34.0)
HX MCHC: 33.3 g/dL (ref 32.0–36.0)
HX MCV: 89.9 fL (ref 80.0–98.0)
HX MONO #: 1.4 10*3/uL — ABNORMAL HIGH (ref 0.2–0.8)
HX MONO: 13 %
HX MPV: 10.2 fL (ref 9.1–11.7)
HX NEUT #: 5.8 10*3/uL (ref 1.5–7.5)
HX PLT: 178 10*3/uL (ref 150–400)
HX RBC BLOOD COUNT: 4.64 M/uL (ref 4.20–5.50)
HX RDW: 14.1 % (ref 11.5–14.5)
HX SEG NEUT: 51 %
HX WBC: 11.4 10*3/uL — ABNORMAL HIGH (ref 4.0–11.0)

## 2015-10-14 LAB — HX CHEM-BLOODGAS
HX BASE EXCESS: 5.2 mmol/L
HX BICARBONATE: 30 meq/L — ABNORMAL HIGH (ref 21–28)
HX CALCULATED CO2: 26 meq/L (ref 17–32)
HX CARBOXYHEMOGLOBIN: 0.8 % (ref 0.0–3.0)
HX FIO2: 50
HX METHEMOGLOBIN: 0.6 % (ref 0.0–1.8)
HX OXYGEN SATURATION, MEASURED (SVO2): 92.1 % — ABNORMAL LOW (ref 95.0–98.0)
HX PCO2: 46 mmHg — ABNORMAL HIGH (ref 35–45)
HX PH: 7.43 (ref 7.35–7.45)
HX PO2: 61 mmHg — ABNORMAL LOW (ref 85–105)

## 2015-10-14 LAB — HX CHEM-PANELS
HX ANION GAP: 11 (ref 5–18)
HX BLOOD UREA NITROGEN: 13 mg/dL (ref 6–24)
HX CHLORIDE (CL): 105 meq/L (ref 98–110)
HX CO2: 27 meq/L (ref 20–30)
HX CREATININE (CR): 0.94 mg/dL (ref 0.57–1.30)
HX GFR, AFRICAN AMERICAN: 102 mL/min/{1.73_m2}
HX GFR, NON-AFRICAN AMERICAN: 88 mL/min/{1.73_m2} — ABNORMAL LOW
HX GLUCOSE: 148 mg/dL — ABNORMAL HIGH (ref 70–139)
HX POTASSIUM (K): 3.7 meq/L (ref 3.6–5.1)
HX SODIUM (NA): 143 meq/L (ref 135–145)

## 2015-10-14 LAB — HX CHEM-OTHER
HX MAGNESIUM: 1.8 mg/dL (ref 1.6–2.6)
HX PHOSPHORUS: 2.9 mg/dL (ref 2.7–4.5)

## 2015-10-14 LAB — HX DIABETES: HX GLUCOSE: 148 mg/dL — ABNORMAL HIGH (ref 70–139)

## 2015-10-14 NOTE — ED Provider Notes (Signed)
Marland Kitchen  Name: Alexander, Oliver  MRN: [id number]  Age: 60 yrs  Sex: Male  DOB: February 20, 1956  ArrivalDate: 10/14/2015  Arrival Time: 20:10  Account#: [id number]  Bed Pending Adult  PCP:  Chief Complaint: Throat Problem  .  Presentation:  05/12  20:11 Presenting complaint: EMS states: Pt with a hx of tracheal      lh11        stenosis had dilation at [facility]. E's recently, recurrence of        stenosis on CT, coming for evaluation.  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  - Social history: Smoking status: Patient states was never    smoker of tobacco. Patient/guardian denies using alcohol, but    has a distant history of alcohol abuse, street drugs, IV    drugs.  .  .  Screening:  20:20 SEPSIS SCREENING SIRS Criteria (> = 2) No. 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 Suicide Screening: Patients presentation:        No risk factors. Nutritional screening: No deficits noted.        Tuberculosis screening: No symptoms or risk factors identified.        Fall Risk None identified. Exposure Risk/Travel Screening: None        identified.  .  Vital Signs:  20:41 BP 153 / 103 Left Arm Supine; Pulse 85; Resp 18; Temp 98.2(O);  js25        Pulse Ox 91% on R/A;  21:05 BP 145 / 125; Pulse 87; Resp 18; Pulse Ox 89% ;                 jf16  22:00 BP 144 / 97; Pulse 87; Resp 17; Pulse Ox 90% on R/A;            jf16  21:05 Pt put on 3L NC sats to 93%  Richard.Gulling  .  Neuro Vital Signs:  21:05 GCS: 14,                                                         jf16  .  Marland Kitchen  Alexander, Oliver  [id number]  [id number]  Page 1 of 3  %%PAGE  .  Name: Alexander, Oliver  MRN: [id number]  Age: 61 yrs  Sex: Male  DOB: 11/11/1955  Arrival Date: 10/14/2015  Arrival Time: 20:10  Account#: [id number]  Bed Pending Adult  PCP:  Chief Complaint: Throat Problem  .  Triage Assessment:  20:20 General: Appears in no apparent distress. 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  .  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 Pt had tracheal dilitation at [facility]. E's        09/13/15 dc'd, . s/p tracheostomy 2013. seizure 2013 down for 3        days, now memory issues. No complaints at this time.  21:01 General: Appears comfortable, Behavior is cooperative,          jf16        pleasant. Pain: Denies pain. Neuro: Level of Consciousness is        awake, alert, Oriented to person, place, time, Speech is        normal, Facial symmetry appears normal. Respiratory: Airway is        patent Trachea midline Respiratory effort is even, unlabored.  .  Observations:  20:10 Patient arrived in ED.                                          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                                         lc17  21:17 Nurse's Note: Pt found to be confused. 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 I asked  where he lives now and he responded "[place]" but did not put        the info just given to the fact he cant be working daily in [place].        Pt is unable to recall any medications he takes, "I take what        is prescribed".  22:00 Patient  assigned to A8                                          pb3  22:30 Patient assigned to A8                                          pb3  .  Procedure:  20:39 EKG done. (by ED staff). No Old EKG Reviewed By: Synetta Shadow        MD.  20:45 Maintain field IV. Gauge / site: #20.                           ZO10  21:17 Response to O2 therapy: symptoms improved.                      RU04  22:17 ABG - Blood Gas Sent.                                           kw24  .  Dispensed Medications:  21:29 Drug: Solu-MEDROL 125 mg Route: IVP;                            jf16  21:30 Drug: Duo-Neb - Albuterol/Atrovent - (Atrovent 0.5 mg,          jf16        Albuterol 2.5 mg) Route: Nebulizer;  Marland Kitchen  Alexander, Oliver  [id number]  [id number]  Page 2 of 3  %%PAGE  .  Name: Alexander, Oliver  MRN: [id number]  Age: 1 yrs  Sex: Male  DOB: [birth year]  Arrival Date: 10/14/2015  Arrival Time: 20:10  Account#: [id number]  Bed Pending Adult  PCP:  Chief Complaint: Throat Problem  .  .  .  Interventions:  20:26 Outside Patient Records Scanned into Chart                      dw  20:38 Demo Sheet Scanned into Chart                                   mc31  20:39 Outside Patient Records Scanned into Chart                      mc31  20:39 ECG/EKG Scanned into Chart                                      mc31  20:42 Cardiac monitor on. Pulse ox on.  js25  21:17 Armband on Placed in gown Call light in reach Bed in low        jf16        position Side Rail up X 2. Verbal reassurance given.  23:34 Patient Belongings Scanned into Chart                           nh6  .  Outcome:  21:40 Decision to Hospitalize by Provider.                            kw23  23:28 Patient left the ED.                                            dc  .  Corrections: (The following items were deleted from the chart)  21:05 20:55 Reassessment: Pt arrived from [facility] with c/o    jf16        SOB r/t stenosis to trachea. Pt had tracheal dilitation  at [facility].        E's 09/13/15 dc'd, . s/p tracheostomy 2013. seizure 2013 down        for 3 days, now memory issues. jf16  .  Signatures:  [place] Charon, Delta                                  dw  Trilby Drummer                            RN   dc  [place], Wadley                           mc31  [place], Elease Hashimoto                      BSN  pb3  [address] 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  .  .  .  .  .  .  Alexander, Oliver  [id number]  [id number]  Page 3 of 3  .  %%END

## 2015-10-14 NOTE — ED Provider Notes (Signed)
Marland Kitchen  Name: Alexander, Oliver  MRN: [id number]  Age: 60 yrs  Sex: Male  DOB: 1955-10-26  ArrivalDate: 10/14/2015  Arrival Time: 20:10  Account#: [id number]  .  Working Diagnosis: Disease of upper respiratory tract, unspecified  PCP:  .  Historical:  - Allergies: Keppra;  - PMHx: TBI; Asthma; COPD; Subglottic stenosis; tracheal    stricture; Dementia; Alcoholism; GERD; bowel obstruction;    rhabdomyolysis; High Cholesterol; Hypertension; korsakoffs;  - PSHx: tracheostomy; Heart Surgery;  - Social history: Smoking status: Patient states was never    smoker of tobacco. Patient/guardian denies using alcohol, but    has a distant history of alcohol abuse, street drugs, IV    drugs.  .  .  Vital Signs:  05/12  20:41 BP 153 / 103 Left Arm Supine; Pulse 85; Resp 18; Temp 98.2(O);  js25        Pulse Ox 91% on R/A;  21:05 BP 145 / 125; Pulse 87; Resp 18; Pulse Ox 89% ;                 jf16  22:00 BP 144 / 97; Pulse 87; Resp 17; Pulse Ox 90% on R/A;            jf16  21:05 Pt put on 3L NC sats to 93%                                     jf16  .  Neuro Vital Signs:  21:05 GCS: 14,                                                        jf16  .  MDM:  .  05/12  20:35 Order name: BUN (Blood Urea Nitrogen); Complete Time: 22:18     kw23  05/12  20:35 Order name: CBC/Diff (With Plt); Complete Time: 21:47           kw23  05/12  20:35 Order name: CR (Creatinine); Complete Time: 22:18               kw23  05/12  20:35 Order name: GLU (Glucose); Complete Time: 22:18                 kw23  05/12  20:35 Order name: LYTES (Na, K, Cl, Co2); Complete Time: 22:18        kw23  05/12  21:38 Order name: ABG - Blood Gas; Complete Time: 22:34               kw23  05/12  20:35 Order name: Adult EKG (order using folder); Complete Time: 19:14NW29  05/12  20:35 Order name: EKG (order using folder); Complete Time: 20:40      kw23  05/12  .  Alexander, Oliver  [id number]  [id number]  Page 1 of 3  %%PAGE  .  Name: Alexander, Oliver  MRN: [id number]  Age: 70 yrs  Sex:  Male  DOB: Jun 11, 1955  Arrival Date: 10/14/2015  Arrival Time: 20:10  Account#: [id number]  .  Working Diagnosis: Disease of upper respiratory tract, unspecified  PCP:  .  20:35 Order name: IV; Complete Time: 21:21  kw23  05/12  20:35 Order name: Pulse Oximetry Continuous; Complete Time: 20:55     kw23  05/12  22:11 Order name: GFR, AA; Complete Time: 22:18                       dispa  t  05/12  22:11 Order name: GFR, NAA; Complete Time: 22:18                      dispa  t  05/12  21:12 Order name: Oxygen at 2l/M nasal cannula; Complete Time: 21:21  kw23  .  Dispensed Medications:  21:29 Drug: Solu-MEDROL 125 mg Route: IVP;                            jf16  21:30 Drug: Duo-Neb - Albuterol/Atrovent - (Atrovent 0.5 mg,          jf16        Albuterol 2.5 mg) Route: Nebulizer;  .  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        Patient had outpatient CT scan today which revealed severe        tracheostenosis. 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 According to hospital        chart the patient has been wheezing over the past 2-3 days.        Despite this he states he has been smoking one cigarette per        day review of systems no chest pain no nausea vomiting or        diaphoresis no abdominal pain no leg pain no fevers chills        sweats no change in voice no drooling no change in mental        status remainder review of systems is negative. Social history        he is a smoker has home nebulizer previously was a Research scientist (physical sciences) family history noncontributory. Attending Exam: My        personal exam reveals Alert male large frame in no acute        distress speaking in full sentences  with a normal voice pupils        equal reactive to light oropharynx is clear airway is normal in        the lower midline neck the patient has a tracheostomy scar        which is closed. There is no subcutaneous emphysema there is no        stridor heard in the neck. There is no accessory muscle use.        Bilateral wheezing diffusely cardiac exam regular rate and        rhythm no gallops murmurs rubs abdomen soft nontender        extremities normal skin is warm and dry no calf asymmetry or        pain. Normal neurological exam speech clear normal  motor normal  .  Name:Alexander, Freida Oliver  [id number]  [id number]  Page 2 of 3  %%PAGE  .  Name: Alexander, Oliver  MRN: [id number]  Age: 89 yrs  Sex: Male  DOB: [birth year]  Arrival Date: 10/14/2015  Arrival Time: 20:10  Account#: [id number]  .  Working Diagnosis: Disease of upper respiratory tract, unspecified  PCP:  .        sensory no sensory deficit. Procedures I reviewed the CT image,        chest x-ray which were done today at the morning hospital.        Patient has subglottic stricture. Physician's assistant has        spoken with pulmonary/MICU fellow to discuss the case and plan        of management.  05/13  00:54 Attestation: Assessment and care plan reviewed with             msm        resident/midlevel provider. See their note for details.        Physician Assistant's history reviewed, patient interviewed and        examined. ED Course: Differential diagnoses pneumothorax, COPD        and airway mechanical constriction/obstruction. An emergency        department. Patient has subglottic stenosis. I reviewed the        patient's imaging. I also reviewed the transfer note. Patient        will be admitted. Pulmonary has been notified. No signs of        hypoxia, feeling comfortable after nebulized treatment.        Admitted to medicine pulmonary aware. 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        Attending chart  complete and electronically signed: Sibyl Parr MD.  .  Disposition Summary:  05/12  23:28 10/14/2015 21:40 Hospitalization Ordered by Marrion Coy MD for kw23        Inpatient Admission. Preliminary diagnosis is Disease of upper        respiratory tract, unspecified. Bed requested for Telemetry.        Status is Inpatient Admission. Condition is Stable. Problem is        an acute exacerbation. Symptoms are unchanged.  .  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  Document is preliminary until electronically or manually signed by the atte  nding physician  .  .  .  .  Alexander, Oliver  [id number]  [id number]  Page 3 of 3  .  %%END

## 2015-10-15 LAB — HX MICRO-RESP VIRAL PANEL
HX ADENOVIRUS AB: NOT DETECTED
HX BORDETELLA PERTUSSIS: NOT DETECTED
HX CHLAMYDOPHILA PNEUMONIAE: NOT DETECTED
HX CORONAVIRUS 229E: NOT DETECTED
HX CORONAVIRUS HKU1: NOT DETECTED
HX CORONAVIRUS NL63: NOT DETECTED
HX CORONAVIRUS OC43: NOT DETECTED
HX HUMAN METAPNEUMOVIRUS: NOT DETECTED
HX Human Rhino/Enterovirus: NOT DETECTED
HX INFLUENZA A: NOT DETECTED
HX INFLUENZA B: NOT DETECTED
HX MYCOPLASMA PNEUMONIAE: NOT DETECTED
HX PARAINFLUENZA 1: NOT DETECTED
HX PARAINFLUENZA 2: NOT DETECTED
HX PARAINFLUENZA 3: NOT DETECTED
HX PARAINFLUENZA 4: NOT DETECTED
HX RESPIRATORY SYNCTYIAL VIRUS: NOT DETECTED

## 2015-10-15 LAB — HX HEM-ROUTINE
HX BASO #: 0 10*3/uL (ref 0.0–0.2)
HX BASO: 0 %
HX EOSIN #: 0 10*3/uL (ref 0.0–0.5)
HX EOSIN: 0 %
HX HCT: 41.2 % (ref 37.0–47.0)
HX HGB: 13.7 g/dL (ref 13.5–16.0)
HX IMMATURE GRANULOCYTE#: 0.1 10*3/uL (ref 0.0–0.1)
HX IMMATURE GRANULOCYTE: 1 %
HX LYMPH #: 1.2 10*3/uL (ref 1.0–4.0)
HX LYMPH: 16 %
HX MCH: 29.8 pg (ref 26.0–34.0)
HX MCHC: 33.3 g/dL (ref 32.0–36.0)
HX MCV: 89.6 fL (ref 80.0–98.0)
HX MONO #: 0.1 10*3/uL — ABNORMAL LOW (ref 0.2–0.8)
HX MONO: 1 %
HX MPV: 10.8 fL (ref 9.1–11.7)
HX NEUT #: 5.8 10*3/uL (ref 1.5–7.5)
HX PLT: 171 10*3/uL (ref 150–400)
HX RBC BLOOD COUNT: 4.6 M/uL (ref 4.20–5.50)
HX RDW: 14 % (ref 11.5–14.5)
HX SEG NEUT: 82 %
HX WBC: 7.2 10*3/uL (ref 4.0–11.0)

## 2015-10-15 LAB — HX CHEM-PANELS
HX ANION GAP: 10 (ref 5–18)
HX BLOOD UREA NITROGEN: 15 mg/dL (ref 6–24)
HX CHLORIDE (CL): 104 meq/L (ref 98–110)
HX CO2: 27 meq/L (ref 20–30)
HX CREATININE (CR): 0.91 mg/dL (ref 0.57–1.30)
HX GFR, AFRICAN AMERICAN: 106 mL/min/{1.73_m2}
HX GFR, NON-AFRICAN AMERICAN: 92 mL/min/{1.73_m2}
HX GLUCOSE: 201 mg/dL — ABNORMAL HIGH (ref 70–139)
HX POTASSIUM (K): 4.2 meq/L (ref 3.6–5.1)
HX SODIUM (NA): 141 meq/L (ref 135–145)

## 2015-10-15 LAB — HX CHEM-OTHER
HX CALCIUM (CA): 9.5 mg/dL (ref 8.5–10.5)
HX MAGNESIUM: 1.8 mg/dL (ref 1.6–2.6)
HX PHOSPHORUS: 1.8 mg/dL — ABNORMAL LOW (ref 2.7–4.5)

## 2015-10-15 LAB — HX DIABETES: HX GLUCOSE: 201 mg/dL — ABNORMAL HIGH (ref 70–139)

## 2015-10-15 LAB — HX POINT OF CARE
HX GLUCOSE-POCT: 187 mg/dL — ABNORMAL HIGH (ref 70–139)
HX GLUCOSE-POCT: 203 mg/dL — ABNORMAL HIGH (ref 70–139)
HX GLUCOSE-POCT: 187 mg/dL — ABNORMAL HIGH (ref 70–139)
HX GLUCOSE-POCT: 225 mg/dL — ABNORMAL HIGH (ref 70–139)

## 2015-10-16 LAB — HX POINT OF CARE
HX GLUCOSE-POCT: 203 mg/dL — ABNORMAL HIGH (ref 70–139)
HX GLUCOSE-POCT: 175 mg/dL — ABNORMAL HIGH (ref 70–139)
HX GLUCOSE-POCT: 147 mg/dL — ABNORMAL HIGH (ref 70–139)
HX GLUCOSE-POCT: 180 mg/dL — ABNORMAL HIGH (ref 70–139)

## 2015-10-16 LAB — HX CHEM-PANELS
HX ANION GAP: 10 (ref 5–18)
HX BLOOD UREA NITROGEN: 20 mg/dL (ref 6–24)
HX CHLORIDE (CL): 104 meq/L (ref 98–110)
HX CO2: 27 meq/L (ref 20–30)
HX CREATININE (CR): 0.93 mg/dL (ref 0.57–1.30)
HX GFR, AFRICAN AMERICAN: 103 mL/min/{1.73_m2}
HX GFR, NON-AFRICAN AMERICAN: 89 mL/min/{1.73_m2} — ABNORMAL LOW
HX GLUCOSE: 223 mg/dL — ABNORMAL HIGH (ref 70–139)
HX POTASSIUM (K): 3.8 meq/L (ref 3.6–5.1)
HX SODIUM (NA): 141 meq/L (ref 135–145)

## 2015-10-16 LAB — HX HEM-ROUTINE
HX BASO #: 0 10*3/uL (ref 0.0–0.2)
HX BASO: 0 %
HX EOSIN #: 0 10*3/uL (ref 0.0–0.5)
HX EOSIN: 0 %
HX HCT: 38.8 % (ref 37.0–47.0)
HX HGB: 12.8 g/dL — ABNORMAL LOW (ref 13.5–16.0)
HX IMMATURE GRANULOCYTE#: 0.1 10*3/uL (ref 0.0–0.1)
HX IMMATURE GRANULOCYTE: 0 %
HX LYMPH #: 1.6 10*3/uL (ref 1.0–4.0)
HX LYMPH: 9 %
HX MCH: 29.6 pg (ref 26.0–34.0)
HX MCHC: 33 g/dL (ref 32.0–36.0)
HX MCV: 89.6 fL (ref 80.0–98.0)
HX MONO #: 1 10*3/uL — ABNORMAL HIGH (ref 0.2–0.8)
HX MONO: 5 %
HX MPV: 10.9 fL (ref 9.1–11.7)
HX NEUT #: 15 10*3/uL — ABNORMAL HIGH (ref 1.5–7.5)
HX PLT: 179 10*3/uL (ref 150–400)
HX RBC BLOOD COUNT: 4.33 M/uL (ref 4.20–5.50)
HX RDW: 14.3 % (ref 11.5–14.5)
HX SEG NEUT: 85 %
HX WBC: 17.6 10*3/uL — ABNORMAL HIGH (ref 4.0–11.0)

## 2015-10-16 LAB — HX CHEM-OTHER
HX CALCIUM (CA): 9.7 mg/dL (ref 8.5–10.5)
HX MAGNESIUM: 1.7 mg/dL (ref 1.6–2.6)
HX PHOSPHORUS: 2.4 mg/dL — ABNORMAL LOW (ref 2.7–4.5)

## 2015-10-16 LAB — HX COAGULATION
HX INR PT: 1 (ref 0.9–1.3)
HX PROTHROMBIN TIME: 11.8 s (ref 9.7–14.0)
HX PTT: 26.3 s (ref 25.7–35.7)

## 2015-10-16 LAB — HX DIABETES: HX GLUCOSE: 223 mg/dL — ABNORMAL HIGH (ref 70–139)

## 2015-10-17 LAB — HX CHEM-PANELS
HX ANION GAP: 12 (ref 5–18)
HX BLOOD UREA NITROGEN: 23 mg/dL (ref 6–24)
HX CHLORIDE (CL): 105 meq/L (ref 98–110)
HX CO2: 28 meq/L (ref 20–30)
HX CREATININE (CR): 0.89 mg/dL (ref 0.57–1.30)
HX GFR, AFRICAN AMERICAN: 108 mL/min/{1.73_m2}
HX GFR, NON-AFRICAN AMERICAN: 93 mL/min/{1.73_m2}
HX GLUCOSE: 147 mg/dL — ABNORMAL HIGH (ref 70–139)
HX POTASSIUM (K): 4.2 meq/L (ref 3.6–5.1)
HX SODIUM (NA): 145 meq/L (ref 135–145)

## 2015-10-17 LAB — HX HEM-ROUTINE
HX HCT: 40.6 % (ref 37.0–47.0)
HX HGB: 13 g/dL — ABNORMAL LOW (ref 13.5–16.0)
HX MCH: 29 pg (ref 26.0–34.0)
HX MCHC: 32 g/dL (ref 32.0–36.0)
HX MCV: 90.6 fL (ref 80.0–98.0)
HX MPV: 10.7 fL (ref 9.1–11.7)
HX NRBC #: 0 10*3/uL
HX NUCLEATED RBC: 0 %
HX PLT: 173 10*3/uL (ref 150–400)
HX RBC BLOOD COUNT: 4.48 M/uL (ref 4.20–5.50)
HX RDW: 14.7 % — ABNORMAL HIGH (ref 11.5–14.5)
HX WBC: 19.3 10*3/uL — ABNORMAL HIGH (ref 4.0–11.0)

## 2015-10-17 LAB — HX POINT OF CARE
HX GLUCOSE-POCT: 156 mg/dL — ABNORMAL HIGH (ref 70–139)
HX GLUCOSE-POCT: 185 mg/dL — ABNORMAL HIGH (ref 70–139)

## 2015-10-17 LAB — HX CHEM-OTHER
HX CALCIUM (CA): 9.3 mg/dL (ref 8.5–10.5)
HX MAGNESIUM: 2.1 mg/dL (ref 1.6–2.6)
HX PHOSPHORUS: 3.8 mg/dL (ref 2.7–4.5)

## 2015-10-17 LAB — HX DIABETES: HX GLUCOSE: 147 mg/dL — ABNORMAL HIGH (ref 70–139)

## 2015-10-20 ENCOUNTER — Ambulatory Visit: Admitting: Thoracic Surgery (Cardiothoracic Vascular Surgery)

## 2015-10-20 NOTE — Progress Notes (Signed)
* * *        **Benney, Kele**    --- ---    49 Y old Male, DOB: April 22, 1956, External MRN: [id number]    Account Number: [id number]    [address] [place], [id number]    Home: [phone]    Insurance: [employer] Gunning PPO Payer ID: PAPER    PCP: Davenport Monte, MD Referring: [place] Monte, MD External Visit ID:  [id number]    Appointment Facility: Thoracic Surgery        * * *    10/20/2015  Progress Notes: Val Riles, MD **CHN#:** [id number]    --- ---    ---        Current Medications    ---    Taking     * Atorvastatin Calcium 40 MG Tablet 1 tablet Once a day    ---    * Divalproex Sodium 250 mg Tablet Delayed Release twice daily    ---    * Divalproex Sodium 125 MG Tablet Delayed Release twice daily    ---    * Magnesium Oxide 400 MG Tablet 1 tablet as needed Once a day    ---    * METOPROLOL SUCC ER 25 MG TASAN     ---    * Pantoprazole Sodium 40 MG Tablet Delayed Release 1 tablet Once a day    ---    * Paroxetine HCl 40 MG Tablet 1 tablet in the morning Once a day    ---    * Vitamin B12 100 MCG Tablet     ---    * Albuterol Sulfate 108 (90 Base) MCG/ACT Aerosol Powder Breath Activated 1 puff as needed every 4 hrs    ---    * Ipratropium-Albuterol 0.5-2.5 (3) MG/3ML Solution 3 ml every 6 hrs    ---    * Medication List reviewed and reconciled with the patient    ---      Past Medical History    ---       Tracheal stenosis.        ---    Wernicke-Korsakoff.        ---    Hypertension.        ---    GERD.        ---    COPD.        ---      Surgical History    ---      Tracheostomy    ---      Family History    ---      Mother: deceased    ---    Father: deceased    ---      Social History    ---    Tobacco history: Formerly smoked.    Alcohol  Past Hx of alcohol abuse.      Allergies    ---      N.K.D.A.    ---    Forrestine Him Verified]      Review of Systems    ---     _Surgery_ :    Constitutional: no fever, chills, or general weakness, no recent weight  change. H&N: no ear pain, no hoarseness, no  headache. Skin: no skin lesions,  no rash. Lymphatics: no swollen glands, no painful glands. Heart: no chest  pain, no fluttering of heart. Lung: pos shortness of breath, no new or  frequent cough. Gastro: no nausea/vomiting, no abdominal pain or cramps, no  constipation or diarrhea,  no blood in stool, no heartburn, no regurgitation.  Urine: no blood in urine, no pain or burning while urinating. Urologic: no  lump or mass in testicle, no discharge from penis. Neuro: no dizzy spells,  fainting, no visual loss, no speech disturbance, no motor or sensory events,  no seizures. Musc: no bone or joint pain.            Reason for Appointment    ---      1\. Tracheal stenosis    ---      History of Present Illness    ---     _GENERAL_ :    I had the pleasure of seeing your patient Alexander Oliver in my thoracic surgical  clinic at [facility]. As you know he is a 60 year old male with a  end-stage neurological effects of alcoholism and a history of a tracheostomy  with a proximal tracheal stenosis measuring approximate 9 mm. He has been  through 1 dilatation with limited benefit. He has been offered a tracheal  resection at [facility]. He and his family are here for a  second opinion. Today in clinic he is accompanied by his sister. He does not  understand his current situation and has significant short-term memory loss. I  understand through his sisters that he has significant symptoms of tracheal  stenosis with increased shortness of breath. They deny significant stridor.  His symptoms of shortness of breath seem to be increasing. They are very aware  of his overall situation and prognosis.      Vital Signs    ---    Pain scale 0, Ht-in 68, Wt-lbs 240, BMI 36.49, BP 118/78, HR 81, RR 18, O2 93,  Temp 97.1.      Physical Examination    ---     _Thoracic Exam_ :    General Appearance: alert, oriented, no distress. Head and Neck: Anicteric,  neck supple, no cervical adenopathy, no superclavicular  adenopathy. Lungs:  clear to auscultation bilaterally, no wheezes, rales, or ronchi, No stridor.  Heart: regular, normal rate and rythym, no murmurs, rubs, or gallops. Abdomen:  soft, nontender, nondistended, no hepatosplenomegaly, normal bowel sounds.  Extremities: warm, well perfused, no clubbing, cyanosis or edema. Skin: no  obvious rashes or lesions. Neuro: Grossly intact with a normal gait.          Assessments    ---    1\. 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 Tracheal stenosis**    Notes: I reviewed the images with Mr. Hollern family. I answered all of their  questions. I outlined our options in detail: Tracheal resection, laser  ablation, T-tube or a chronic indwelling tracheostomy tube and repeat  dilatation. We discussed each in great detail. We outlined all of the pros and  cons regarding each of the treatment options. With his anatomy he really  requires a tracheal resection but he is a very poor candidate for such a  surgery. We discussed this issue at length we also discussed goals of care. In  the end the requested time to think about all the information that we  discussed. It is my impression that they will most likely leave well enough  alone and take a time they have with him as he is without pursuing significant  intervention. If you have any questions or concerns please do not hesitate to  contact me.    ---      Follow Up    ---    prn    Electronically signed by Val Riles , MD on 10/21/2015 at 10:26 AM  EDT    Sign off status: Completed        * * *        Thoracic Surgery    [address]    [place], [place] [address]    Tel: [phone]    Fax: [phone]              * * *          Patient: Alexander Oliver, Alexander Oliver DOB: Feb 05, 1956 Progress Note: Val Riles,  MD 10/20/2015    ---    Note generated by eClinicalWorks EMR/PM Software ([email or web address])

## 2015-10-20 NOTE — Progress Notes (Signed)
.  Progress Notes  .  Patient: Alexander Oliver  Provider: Vallery Sa  DOB: 11-Sep-1955 Age: 60 Y Sex: Male  .  PCP: Mitchell Monte  MD  Date: 10/20/2015  .  --------------------------------------------------------------------------------  .  REASON FOR APPOINTMENT  .  1. Tracheal stenosis  .  HISTORY OF PRESENT ILLNESS  .  GENERAL:   I had the pleasure of seeing your patient Hicks Feick in my  thoracic surgical clinic at [facility]. As you know he  is a 60 year old male with a end-stage neurological effects of  alcoholism and a history of a tracheostomy with a proximal  tracheal stenosis measuring approximate 9 mm. He has been through  1 dilatation with limited benefit. He has been offered a tracheal  resection at [facility]. He and his family are  here for a second opinion. Today in clinic he is accompanied by  his sister. He does not understand his current situation and has  significant short-term memory loss. I understand through his  sisters that he has significant symptoms of tracheal stenosis  with increased shortness of breath. They deny significant  stridor. His symptoms of shortness of breath seem to be  increasing. They are very aware of his overall situation and  prognosis.  Marland Kitchen  CURRENT MEDICATIONS  .  Taking Atorvastatin Calcium 40 MG Tablet 1 tablet Once a day  Taking Divalproex Sodium 250 mg Tablet Delayed Release twice  daily  Taking Divalproex Sodium 125 MG Tablet Delayed Release twice  daily  Taking Magnesium Oxide 400 MG Tablet 1 tablet as needed Once a  day  Taking METOPROLOL SUCC ER 25 MG TASAN  Taking Pantoprazole Sodium 40 MG Tablet Delayed Release 1 tablet  Once a day  Taking Paroxetine HCl 40 MG Tablet 1 tablet in the morning Once a  day  Taking Vitamin B12 100 MCG Tablet  Taking Albuterol Sulfate 108 (90 Base) MCG/ACT Aerosol Powder  Breath Activated 1 puff as needed every 4 hrs  Taking Ipratropium-Albuterol 0.5-2.5 (3) MG/3ML Solution 3 ml  every 6  hrs  Medication List reviewed and reconciled with the patient  .  PAST MEDICAL HISTORY  .  Tracheal stenosis  Wernicke-Korsakoff  Hypertension  GERD  COPD  .  ALLERGIES  .  N.K.D.A.  .  SURGICAL HISTORY  .  Tracheostomy  .  FAMILY HISTORY  .  Mother: deceased  Father: deceased  .  SOCIAL HISTORY  .  .  Tobaccohistory:Formerly smoked.  .  Alcohol Past Hx of alcohol abuse.  Marland Kitchen  REVIEW OF SYSTEMS  .  Surgery:  .  Constitutional:    no fever, chills, or general weakness, no  recent weight change . H&N:    no ear pain, no hoarseness, no  headache . Skin:    no skin lesions, no rash . Lymphatics:    no  swollen glands, no painful glands . Heart:    no chest pain, no  fluttering of heart . Lung:    pos shortness of breath, no new or  frequent cough . Gastro:    no nausea/vomiting, no abdominal pain  or cramps, no constipation or diarrhea, no blood in stool, no  heartburn, no regurgitation . Urine:    no blood in urine, no  pain or burning while urinating . Urologic:    no lump or mass in  testicle, no discharge from penis . Neuro:    no dizzy spells,  fainting, no  visual loss, no speech disturbance, no motor or  sensory events, no seizures . Musc:    no bone or joint pain .  Marland Kitchen  VITAL SIGNS  .  Pain scale 0, Ht-in 68, Wt-lbs 240, BMI 36.49, BP 118/78, HR 81,  RR 18, O2 93, Temp 97.1.  Marland Kitchen  PHYSICAL EXAMINATION  .  Thoracic Exam:  General Appearance:   alert, oriented, no distress. Head and  Neck:   Anicteric, neck supple, no cervical adenopathy, no  superclavicular adenopathy. Lungs:   clear to auscultation  bilaterally, no wheezes, rales, or ronchi, No stridor. Heart:    regular, normal rate and rythym, no murmurs, rubs, or gallops.  Abdomen:   soft, nontender, nondistended, no hepatosplenomegaly,  normal bowel sounds. Extremities:   warm, well perfused, no  clubbing, cyanosis or edema. Skin:   no obvious rashes or  lesions. Neuro:   Grossly intact with a normal gait.  .  ASSESSMENTS  .  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  TREATMENT  .  Tracheal stenosis  Notes: I reviewed the images with Mr. Mcartor family. I answered  all of their questions. I outlined our options in detail:  Tracheal resection, laser ablation, T-tube or a chronic  indwelling tracheostomy tube and repeat dilatation. We discussed  each in great detail. We outlined all of the pros and cons  regarding each of the treatment options. With his anatomy he  really requires a tracheal resection but he is a very poor  candidate for such a surgery. We discussed this issue at length  we also discussed goals of care. In the end the requested time to  think about all the information that we discussed. It is my  impression that they will most likely leave well enough alone and  take a time they have with him as he is without pursuing  significant intervention. If you have any questions or concerns  please do not hesitate to contact me.  .  FOLLOW UP  .  prn  .  Electronically signed by Val Riles , MD  on 10/21/2015 at 10:26 AM EDT  .  Document electronically signed by Vallery Sa

## 2015-10-21 ENCOUNTER — Ambulatory Visit (HOSPITAL_BASED_OUTPATIENT_CLINIC_OR_DEPARTMENT_OTHER): Admitting: Psychiatry

## 2015-11-03 DEATH — deceased

## 2020-08-30 NOTE — Progress Notes (Signed)
* * *        **Benney, Kele**    --- ---    49 Y old Male, DOB: April 22, 1956, External MRN: [id number]    Account Number: [id number]    [address] [place], [id number]    Home: [phone]    Insurance: [employer]ene Gunning PPO Payer ID: PAPER    PCP: Davenport Monte, MD Referring: [place] Monte, MD External Visit ID:  [id number]    Appointment Facility: Thoracic Surgery        * * *    10/20/2015  Progress Notes: Val Riles, MD **CHN#:** [id number]    --- ---    ---        Current Medications    ---    Taking     * Atorvastatin Calcium 40 MG Tablet 1 tablet Once a day    ---    * Divalproex Sodium 250 mg Tablet Delayed Release twice daily    ---    * Divalproex Sodium 125 MG Tablet Delayed Release twice daily    ---    * Magnesium Oxide 400 MG Tablet 1 tablet as needed Once a day    ---    * METOPROLOL SUCC ER 25 MG TASAN     ---    * Pantoprazole Sodium 40 MG Tablet Delayed Release 1 tablet Once a day    ---    * Paroxetine HCl 40 MG Tablet 1 tablet in the morning Once a day    ---    * Vitamin B12 100 MCG Tablet     ---    * Albuterol Sulfate 108 (90 Base) MCG/ACT Aerosol Powder Breath Activated 1 puff as needed every 4 hrs    ---    * Ipratropium-Albuterol 0.5-2.5 (3) MG/3ML Solution 3 ml every 6 hrs    ---    * Medication List reviewed and reconciled with the patient    ---      Past Medical History    ---       Tracheal stenosis.        ---    Wernicke-Korsakoff.        ---    Hypertension.        ---    GERD.        ---    COPD.        ---      Surgical History    ---      Tracheostomy    ---      Family History    ---      Mother: deceased    ---    Father: deceased    ---      Social History    ---    Tobacco history: Formerly smoked.    Alcohol  Past Hx of alcohol abuse.      Allergies    ---      N.K.D.A.    ---    Forrestine Him Verified]      Review of Systems    ---     _Surgery_ :    Constitutional: no fever, chills, or general weakness, no recent weight  change. H&N: no ear pain, no hoarseness, no  headache. Skin: no skin lesions,  no rash. Lymphatics: no swollen glands, no painful glands. Heart: no chest  pain, no fluttering of heart. Lung: pos shortness of breath, no new or  frequent cough. Gastro: no nausea/vomiting, no abdominal pain or cramps, no  constipation or diarrhea,  no blood in stool, no heartburn, no regurgitation.  Urine: no blood in urine, no pain or burning while urinating. Urologic: no  lump or mass in testicle, no discharge from penis. Neuro: no dizzy spells,  fainting, no visual loss, no speech disturbance, no motor or sensory events,  no seizures. Musc: no bone or joint pain.            Reason for Appointment    ---      1\. Tracheal stenosis    ---      History of Present Illness    ---     _GENERAL_ :    I had the pleasure of seeing your patient Alexander Oliver in my thoracic surgical  clinic at [facility]. As you know he is a 65 year old male with a  end-stage neurological effects of alcoholism and a history of a tracheostomy  with a proximal tracheal stenosis measuring approximate 9 mm. He has been  through 1 dilatation with limited benefit. He has been offered a tracheal  resection at [facility]. He and his family are here for a  second opinion. Today in clinic he is accompanied by his sister. He does not  understand his current situation and has significant short-term memory loss. I  understand through his sisters that he has significant symptoms of tracheal  stenosis with increased shortness of breath. They deny significant stridor.  His symptoms of shortness of breath seem to be increasing. They are very aware  of his overall situation and prognosis.      Vital Signs    ---    Pain scale 0, Ht-in 68, Wt-lbs 240, BMI 36.49, BP 118/78, HR 81, RR 18, O2 93,  Temp 97.1.      Physical Examination    ---     _Thoracic Exam_ :    General Appearance: alert, oriented, no distress. Head and Neck: Anicteric,  neck supple, no cervical adenopathy, no superclavicular  adenopathy. Lungs:  clear to auscultation bilaterally, no wheezes, rales, or ronchi, No stridor.  Heart: regular, normal rate and rythym, no murmurs, rubs, or gallops. Abdomen:  soft, nontender, nondistended, no hepatosplenomegaly, normal bowel sounds.  Extremities: warm, well perfused, no clubbing, cyanosis or edema. Skin: no  obvious rashes or lesions. Neuro: Grossly intact with a normal gait.          Assessments    ---    1\. 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 Tracheal stenosis**    Notes: I reviewed the images with Mr. Hollern family. I answered all of their  questions. I outlined our options in detail: Tracheal resection, laser  ablation, T-tube or a chronic indwelling tracheostomy tube and repeat  dilatation. We discussed each in great detail. We outlined all of the pros and  cons regarding each of the treatment options. With his anatomy he really  requires a tracheal resection but he is a very poor candidate for such a  surgery. We discussed this issue at length we also discussed goals of care. In  the end the requested time to think about all the information that we  discussed. It is my impression that they will most likely leave well enough  alone and take a time they have with him as he is without pursuing significant  intervention. If you have any questions or concerns please do not hesitate to  contact me.    ---      Follow Up    ---    prn    Electronically signed by Val Riles , MD on 10/21/2015 at 10:26 AM  EDT    Sign off status: Completed        * * *        Thoracic Surgery    [address]    [place], [place] [address]    Tel: [phone]    Fax: [phone]              * * *          Patient: Alexander Oliver, Alexander Oliver DOB: Feb 05, 1956 Progress Note: Val Riles,  MD 10/20/2015    ---    Note generated by eClinicalWorks EMR/PM Software ([email or web address])
